# Patient Record
Sex: Female | Born: 1995 | Race: White | Hispanic: No | Marital: Single | Smoking: Never smoker
Health system: Southern US, Community
[De-identification: ages and names within clinical notes are randomized; demographics above are authoritative.]

## PROBLEM LIST (undated history)

## (undated) DIAGNOSIS — R55 Syncope and collapse: Secondary | ICD-10-CM

## (undated) DIAGNOSIS — R42 Dizziness and giddiness: Secondary | ICD-10-CM

## (undated) DIAGNOSIS — R0602 Shortness of breath: Secondary | ICD-10-CM

## (undated) DIAGNOSIS — M25871 Other specified joint disorders, right ankle and foot: Secondary | ICD-10-CM

## (undated) DIAGNOSIS — J387 Other diseases of larynx: Secondary | ICD-10-CM

## (undated) HISTORY — PX: POLYPECTOMY: SHX149

## (undated) HISTORY — DX: Other diseases of larynx: J38.7

## (undated) HISTORY — PX: TONSILLECTOMY: SUR1361

## (undated) HISTORY — DX: Shortness of breath: R06.02

## (undated) HISTORY — DX: Syncope and collapse: R55

## (undated) HISTORY — PX: TYMPANOSTOMY TUBE PLACEMENT: SHX32

## (undated) HISTORY — DX: Dizziness and giddiness: R42

## (undated) HISTORY — DX: Other specified joint disorders, right ankle and foot: M25.871

---

## 2018-01-30 ENCOUNTER — Ambulatory Visit (INDEPENDENT_AMBULATORY_CARE_PROVIDER_SITE_OTHER): Payer: Self-pay | Admitting: Orthopedic Surgery

## 2018-01-30 DIAGNOSIS — M25511 Pain in right shoulder: Secondary | ICD-10-CM

## 2018-01-31 ENCOUNTER — Other Ambulatory Visit (INDEPENDENT_AMBULATORY_CARE_PROVIDER_SITE_OTHER): Payer: Self-pay | Admitting: Orthopedic Surgery

## 2018-01-31 DIAGNOSIS — M25511 Pain in right shoulder: Secondary | ICD-10-CM

## 2018-02-06 ENCOUNTER — Encounter (INDEPENDENT_AMBULATORY_CARE_PROVIDER_SITE_OTHER): Payer: Self-pay | Admitting: Orthopedic Surgery

## 2018-02-06 NOTE — Progress Notes (Signed)
   Post-Op Visit Note   Patient: Hannah Bush           Date of Birth: 1995/03/26           MRN: [id number] Visit Date: 01/30/2018 PCP: No primary care provider on file.   Assessment & Plan:  Chief Complaint: No chief complaint on file.  Visit Diagnoses:  1. Acute pain of right shoulder     Plan: Patient presents for evaluation of right shoulder pain.  She had a fall on her outstretched arm.  This happened yesterday.  I reviewed the real-time game film of the event.  She has had mild improvement but this could be either a subluxation episode or a superior labral tear type of episode.  She has been taking Motrin.  On examination she has positive O'Brien's on the right negative on the left.  Good rotator cuff strength.  No masses lymphadenopathy or skin changes noted in that shoulder girdle region.  She has equivocal apprehension for anterior instability.  Both shoulders on exam supine have about the same amount of anterior and posterior instability  Follow-Up Instructions: Return if symptoms worsen or fail to improve.   Orders:  No orders of the defined types were placed in this encounter.  No orders of the defined types were placed in this encounter.   Imaging: No results found.  PMFS History: There are no active problems to display for this patient.  History reviewed. No pertinent past medical history.  History reviewed. No pertinent family history.  History reviewed. No pertinent surgical history. Social History   Occupational History  . Not on file  Tobacco Use  . Smoking status: Not on file  Substance and Sexual Activity  . Alcohol use: Not on file  . Drug use: Not on file  . Sexual activity: Not on file

## 2018-02-15 ENCOUNTER — Other Ambulatory Visit: Payer: Self-pay

## 2018-05-01 ENCOUNTER — Emergency Department (HOSPITAL_COMMUNITY)
Admission: EM | Admit: 2018-05-01 | Discharge: 2018-05-01 | Disposition: A | Discharge status: Home / Self Care | Payer: Self-pay | Attending: Emergency Medicine | Admitting: Emergency Medicine

## 2018-05-01 ENCOUNTER — Encounter (HOSPITAL_COMMUNITY): Payer: Self-pay | Admitting: Family Medicine

## 2018-05-01 DIAGNOSIS — R55 Syncope and collapse: Secondary | ICD-10-CM | POA: Insufficient documentation

## 2018-05-01 DIAGNOSIS — Z79899 Other long term (current) drug therapy: Secondary | ICD-10-CM | POA: Insufficient documentation

## 2018-05-01 HISTORY — DX: Syncope and collapse: R55

## 2018-05-01 LAB — BASIC METABOLIC PANEL
Anion gap: 8 (ref 5–15)
BUN: 11 mg/dL (ref 6–20)
CO2: 23 mmol/L (ref 22–32)
Calcium: 9.5 mg/dL (ref 8.9–10.3)
Chloride: 107 mmol/L (ref 98–111)
Creatinine, Ser: 0.72 mg/dL (ref 0.44–1.00)
GFR calc Af Amer: >60 mL/min (ref >60)
GFR calc non Af Amer: >60 mL/min (ref >60)
Glucose, Bld: 99 mg/dL (ref 70–99)
Potassium: 4.2 mmol/L (ref 3.5–5.1)
Sodium: 138 mmol/L (ref 135–145)

## 2018-05-01 LAB — URINALYSIS, ROUTINE W REFLEX MICROSCOPIC
Bilirubin Urine: NEGATIVE
Glucose, UA: NEGATIVE mg/dL
Ketones, ur: NEGATIVE mg/dL
Leukocytes, UA: NEGATIVE
Nitrite: NEGATIVE
Protein, ur: NEGATIVE mg/dL
Specific Gravity, Urine: 1.011 (ref 1.005–1.030)
pH: 5 (ref 5.0–8.0)

## 2018-05-01 LAB — CBC
HCT: 42.2 % (ref 36.0–46.0)
Hemoglobin: 13.5 g/dL (ref 12.0–15.0)
MCH: 29.9 pg (ref 26.0–34.0)
MCHC: 32 g/dL (ref 30.0–36.0)
MCV: 93.4 fL (ref 80.0–100.0)
Platelets: 222 10*3/uL (ref 150–400)
RBC: 4.52 MIL/uL (ref 3.87–5.11)
RDW: 12.7 % (ref 11.5–15.5)
WBC: 5.3 10*3/uL (ref 4.0–10.5)
nRBC: 0 % (ref 0.0–0.2)

## 2018-05-01 LAB — I-STAT BETA HCG BLOOD, ED (MC, WL, AP ONLY)

## 2018-05-01 LAB — CBG MONITORING, ED: Glucose-Capillary: 82 mg/dL (ref 70–99)

## 2018-05-01 MED ORDER — SODIUM CHLORIDE 0.9% FLUSH: 3.0000 mL | Freq: Once | INTRAVENOUS | Status: DC

## 2018-05-01 MED ORDER — SODIUM CHLORIDE 0.9 % IV BOLUS
1000.0000 mL | Freq: Once | INTRAVENOUS | Status: AC
  Administered 2018-05-01: 1000 mL via INTRAVENOUS

## 2018-05-01 NOTE — ED Triage Notes (Signed)
Patient reports she had a witnessed syncopal episode for a few seconds. Patients athlete trainer has accompanied patient but was not present when she had her syncopal episode. Athlete trainer reports that someone caught her and didn't hit her head. Patient has had these same symptoms before and dx vasovagal. For the last three days patient reports she has been feeling lightheadedness. Patient states she has been drinking plenty of fluids.

## 2018-05-01 NOTE — ED Provider Notes (Signed)
[facility]-EMERGENCY DEPT Provider Note   CSN: [id number] Arrival date & time: 05/01/18  1316     History   Chief Complaint Chief Complaint  Patient presents with  . Loss of Consciousness    HPI Lenola Capitano is a 23 y.o. female.  HPI  23 year old female presents after syncope.  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  She had been standing for an hour plus.  She started to all of a sudden feel lightheaded and like her vision was darkening.  This lasted a few minutes and then she passed out.  She was caught by someone and did not injure herself.  No headache, chest pain, shortness of breath, vomiting, or any other symptoms.  She felt a little lightheaded when she first awoke and was only unconscious for a few seconds.  However now she is feeling fine.  She has had syncope a few times before in the past and has been told that it was vasovagal when she sought medical care.  No palpitations.  Patient has previously been feeling fine and ate normal meals today.  No strenuous activity today.  Past Medical History:  Diagnosis Date  . Vaso vagal episode     There are no active problems to display for this patient.   Past Surgical History:  Procedure Laterality Date  . TONSILLECTOMY    . TYMPANOSTOMY TUBE PLACEMENT       OB History   No obstetric history on file.      Home Medications    Prior to Admission medications   Medication Sig Start Date End Date Taking? Authorizing Provider  Cholecalciferol (VITAMIN D) 10 MCG/ML LIQD Take 10 mcg by mouth once a week.   Yes [provider]  ibuprofen (ADVIL,MOTRIN) 200 MG tablet Take 400-800 mg by mouth every 6 (six) hours as needed for headache or moderate pain.   Yes [provider]    Family History History reviewed. No pertinent family history.  Social History Social History   Tobacco Use  . Smoking status: Never Smoker  . Smokeless tobacco: Never Used  Substance Use  Topics  . Alcohol use: Not Currently  . Drug use: Never     Allergies   Patient has no allergy information on record.   Review of Systems Review of Systems  Respiratory: Negative for shortness of breath.   Cardiovascular: Negative for chest pain.  Gastrointestinal: Negative for abdominal pain and vomiting.  Neurological: Positive for syncope and light-headedness. Negative for headaches.  All other systems reviewed and are negative.    Physical Exam Updated Vital Signs BP 127/82 (BP Location: Left Arm)   Pulse 64   Temp 98.4 F (36.9 C) (Oral)   Resp 12   Ht 6' (1.829 m)   Wt 81.6 kg   LMP 04/29/2018   SpO2 100%   BMI 24.41 kg/m   Physical Exam Vitals signs and nursing note reviewed.  Constitutional:      General: She is not in acute distress.    Appearance: She is well-developed. She is not ill-appearing or diaphoretic.  HENT:     Head: Normocephalic and atraumatic.     Right Ear: External ear normal.     Left Ear: External ear normal.     Nose: Nose normal.  Eyes:     General:        Right eye: No discharge.        Left eye: No discharge.  Cardiovascular:  Rate and Rhythm: Normal rate and regular rhythm.     Heart sounds: Normal heart sounds. No murmur.  Pulmonary:     Effort: Pulmonary effort is normal.     Breath sounds: Normal breath sounds.  Abdominal:     Palpations: Abdomen is soft.     Tenderness: There is no abdominal tenderness.  Skin:    General: Skin is warm and dry.  Neurological:     Mental Status: She is alert.     Comments: CN 3-12 grossly intact. 5/5 strength in all 4 extremities. Grossly normal sensation. Normal finger to nose.   Psychiatric:        Mood and Affect: Mood is not anxious.      ED Treatments / Results  Labs (all labs ordered are listed, but only abnormal results are displayed) Labs Reviewed  BASIC METABOLIC PANEL  CBC  URINALYSIS, ROUTINE W REFLEX MICROSCOPIC  CBG MONITORING, ED  I-STAT BETA HCG BLOOD, ED  ([facility], [facility], [facility] ONLY)    EKG EKG Interpretation  Date/Time:  Monday May 01 2018 13:35:05 EST Ventricular Rate:  68 PR Interval:    QRS Duration: 88 QT Interval:  382 QTC Calculation: 407 R Axis:   78 Text Interpretation:  Normal sinus rhythm ST elev, probable normal early repol pattern No old tracing to compare Confirmed by Pricilla Loveless [phone]) on 05/01/2018 3:52:26 PM   Radiology No results found.  Procedures Procedures (including critical care time)  Medications Ordered in ED Medications  sodium chloride flush (NS) 0.9 % injection 3 mL (has no administration in time range)  sodium chloride 0.9 % bolus 1,000 mL (1,000 mLs Intravenous New Bag/Given 05/01/18 1545)     Initial Impression / Assessment and Plan / ED Course  I have reviewed the triage vital signs and the nursing notes.  Pertinent labs & imaging results that were available during my care of the patient were reviewed by me and considered in my medical decision making (see chart for details).     Patient's vital signs, labs, exam and ECG are all benign.  No concerning findings on ECG that would indicate a cardiac cause of syncope.  No concerning historical findings such as sudden onset headache.  Probably this was from prolonged standing.  Otherwise, I feel this is likely benign syncope and she can follow-up with PCP.  We discussed return precautions.  Final Clinical Impressions(s) / ED Diagnoses   Final diagnoses:  Syncope and collapse    ED Discharge Orders    None       Pricilla Loveless, MD 05/01/18 1625

## 2018-05-01 NOTE — Discharge Instructions (Signed)
Be sure to follow-up with your primary care physician.  If you develop recurrent passing out, passing out while doing exercise or strenuous activity, severe headache, chest pain, shortness of breath, palpitations, or any other new/concerning symptoms then return to the ER for evaluation.

## 2018-05-10 ENCOUNTER — Encounter: Payer: Self-pay | Admitting: Family Medicine

## 2018-05-10 DIAGNOSIS — R55 Syncope and collapse: Secondary | ICD-10-CM

## 2018-05-10 HISTORY — DX: Syncope and collapse: R55

## 2018-05-16 ENCOUNTER — Ambulatory Visit (INDEPENDENT_AMBULATORY_CARE_PROVIDER_SITE_OTHER): Payer: 59 | Admitting: Cardiology

## 2018-05-16 ENCOUNTER — Ambulatory Visit: Payer: Self-pay | Admitting: Cardiology

## 2018-05-16 ENCOUNTER — Encounter: Payer: Self-pay | Admitting: Cardiology

## 2018-05-16 VITALS — BP 120/80 | HR 71 | Ht 72.0 in | Wt 178.2 lb

## 2018-05-16 DIAGNOSIS — R55 Syncope and collapse: Secondary | ICD-10-CM | POA: Diagnosis not present

## 2018-05-16 NOTE — Progress Notes (Signed)
Cardiology Office Note    Date:  05/18/2018   ID:  Hannah Bush, DOB 1995-08-27, MRN [id number]  PCP:  Patient, No Pcp Per  Cardiologist:  Armanda Magic, MD   Chief Complaint  Patient presents with  . New Patient (Initial Visit)    Syncope    History of Present Illness:  Hannah Bush is a 23 y.o. female who is being seen today for the evaluation of syncope at the request of [facility] athletic training Dr.John Susann Givens.  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  Initially started out at [facility] in [place].  She has a history of syncopal episodes in the past and had a complete work-up in [place] in 2018 after presenting to the ER with syncope.    According to the hospital notes in [place] she had been playing basketball that afternoon and started to become short of breath and dizzy.  The room started spinning and she denied any lightheadedness.  This occurred for a few seconds and then she had a syncopal episode for a few seconds and then regained consciousness.  She was only mildly confused for a few seconds afterwards.  She did not have any tongue biting, tonic-clonic seizures.  This apparently happened 1 week prior as well when she was going from sitting to standing.  She says when she gets these episodes she will get flushed and diaphoretic and feel poorly.    She has a family history of chronically low blood pressure in her mother and her sister.  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  Followed on telemetry with no arrhythmias.  Given normal saline IV fluids.  She had a 2D echocardiogram done in December 2018 which revealed normal LV size with low normal LV function EF 50 to 55%, trivial MR, mild to moderate TR.  Chest x-ray was normal.  CT the head was normal.  Labs were all within normal limits.  EKG during that evaluation showed sinus rhythm with sinus  arrhythmia except for a 6-second pause noted on telemetry during a blood draw in the hospital.  She also gives a history of prior syncopal episodes and  had a cardiac work-up years prior to that as well.  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  No further treatment was recommended.  She denies any chest pain or pressure, PND, orthopnea, lower extremity edema, palpitations.  She denies any smoking, alcohol use or recreational drugs.  She takes no medications.  Past Medical History:  Diagnosis Date  . Dizzy   . Laryngeal disorder   . SOB (shortness of breath)   . Syncope and collapse     Past Surgical History:  Procedure Laterality Date  . POLYPECTOMY      Current Medications: No outpatient medications have been marked as taking for the 05/16/18 encounter (Office Visit) with Quintella Reichert, MD.    Allergies:   Patient has no known allergies.   Social History   Socioeconomic History  . Marital status: Single    Spouse name: Not on file  . Number of children: Not on file  . Years of education: Not on file  . Highest education level: Not on file  Occupational History  . Not on file  Social Needs  . Financial resource strain: Not on file  . Food insecurity:  Worry: Not on file    Inability: Not on file  . Transportation needs:    Medical: Not on file    Non-medical: Not on file  Tobacco Use  . Smoking status: Never Smoker  . Smokeless tobacco: Never Used  Substance and Sexual Activity  . Alcohol use: Never    Frequency: Never  . Drug use: Never  . Sexual activity: Not on file  Lifestyle  . Physical activity:    Days per week: Not on file    Minutes per session: Not on file  . Stress: Not on file  Relationships  . Social connections:    Talks on phone: Not on file    Gets together: Not on file    Attends religious service: Not on file    Active member of club or organization: Not  on file    Attends meetings of clubs or organizations: Not on file    Relationship status: Not on file  Other Topics Concern  . Not on file  Social History Narrative  . Not on file     Family History:  The patient's family history includes Hypotension in her mother and sister.   ROS:   Please see the history of present illness.    ROS All other systems reviewed and are negative.  No flowsheet data found.     PHYSICAL EXAM:   VS:  BP 120/80   Pulse 71   Ht 6' (1.829 m)   Wt 178 lb 3.2 oz (80.8 kg)   SpO2 99%   BMI 24.17 kg/m    GEN: Well nourished, well developed, in no acute distress  HEENT: normal  Neck: no JVD, carotid bruits, or masses Cardiac: RRR; no murmurs, rubs, or gallops,no edema.  Intact distal pulses bilaterally.  Respiratory:  clear to auscultation bilaterally, normal work of breathing GI: soft, nontender, nondistended, + BS MS: no deformity or atrophy  Skin: warm and dry, no rash Neuro:  Alert and Oriented x 3, Strength and sensation are intact Psych: euthymic mood, full affect  Wt Readings from Last 3 Encounters:  05/16/18 178 lb 3.2 oz (80.8 kg)     Studies/Labs Reviewed:   EKG:  EKG is ordered today.  The ekg ordered today demonstrates normal sinus rhythm with first-degree AV block and normal QTC and no ST changes.  Recent Labs: No results found for requested labs within last 8760 hours.   Lipid Panel No results found for: CHOL, TRIG, HDL, CHOLHDL, VLDL, LDLCALC, LDLDIRECT  Additional studies/ records that were reviewed today include:  Office and hospital notes from [facility]    ASSESSMENT:    1. Syncope, unspecified syncope type      PLAN:  In order of problems listed above:  1. Syncope -she has had a long history of syncopal episodes.  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 She had a syncopal episodes when she was playing in [place] with a full evaluation by cardiology including a 2D  echocardiogram that was normal and a head CT that was normal.  She was noted to have a 6-second pause on telemetry during that hospital stay during a blood draw.  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  Now she is presenting again with several syncopal episodes.  These occur usually when she is playing basketball and she will all of a sudden start to get flushed and feel hot and  not feel well at all then passed out.  She has had a few episodes when she has not been exerting herself.  She denies any chest pain or pressure.  There is no family history of sudden cardiac death.  She does try to stay well-hydrated.  EKG today shows no ischemia with slightly prolonged PR interval and normal QTC.  I have recommended proceeding with 2D echocardiogram to reassess LV function since her EF was 50 to 55% on the echo in 2018.  I will also get a 30-day event monitor.  It sounds like she does have vasovagal syncope but it is now impeding her ability to play basketball.  Could consider a trial of low-dose beta-blocker or SSRI.  I am going to refer to Dr. Graciela Husbands for further recommendations on how to treat her vasovagal syncope which has become quite a problem when playing basketball competitively.    Medication Adjustments/Labs and Tests Ordered: Current medicines are reviewed at length with the patient today.  Concerns regarding medicines are outlined above.  Medication changes, Labs and Tests ordered today are listed in the Patient Instructions below.  Patient Instructions  Medication Instructions:  Your physician recommends that you continue on your current medications as directed. Please refer to the Current Medication list given to you today.  If you need a refill on your cardiac medications before your next appointment, please call your pharmacy.   Lab work: None If you have labs (blood work) drawn today and your tests are completely normal, you will receive  your results only by: Marland Kitchen MyChart Message (if you have MyChart) OR . A paper copy in the mail If you have any lab test that is abnormal or we need to change your treatment, we will call you to review the results.  Testing/Procedures: Your physician has requested that you have an echocardiogram. Echocardiography is a painless test that uses sound waves to create images of your heart. It provides your doctor with information about the size and shape of your heart and how well your heart's chambers and valves are working. This procedure takes approximately one hour. There are no restrictions for this procedure.  Your physician has recommended that you wear an event monitor. Event monitors are medical devices that record the heart's electrical activity. Doctors most often [place] these monitors to diagnose arrhythmias. Arrhythmias are problems with the speed or rhythm of the heartbeat. The monitor is a small, portable device. You can wear one while you do your normal daily activities. This is usually used to diagnose what is causing palpitations/syncope (passing out).  Follow-Up: As needed following results.   You have been referred to Dr. Graciela Husbands for a tilt test.      Signed, Armanda Magic, MD  05/18/2018 12:39 PM    [facility] [address] [place], [place], [place]  [address] Phone: [phone]; Fax: [phone]

## 2018-05-16 NOTE — Patient Instructions (Signed)
Medication Instructions:  Your physician recommends that you continue on your current medications as directed. Please refer to the Current Medication list given to you today.  If you need a refill on your cardiac medications before your next appointment, please call your pharmacy.   Lab work: None If you have labs (blood work) drawn today and your tests are completely normal, you will receive your results only by: Marland Kitchen MyChart Message (if you have MyChart) OR . A paper copy in the mail If you have any lab test that is abnormal or we need to change your treatment, we will call you to review the results.  Testing/Procedures: Your physician has requested that you have an echocardiogram. Echocardiography is a painless test that uses sound waves to create images of your heart. It provides your doctor with information about the size and shape of your heart and how well your heart's chambers and valves are working. This procedure takes approximately one hour. There are no restrictions for this procedure.  Your physician has recommended that you wear an event monitor. Event monitors are medical devices that record the heart's electrical activity. Doctors most often [place] these monitors to diagnose arrhythmias. Arrhythmias are problems with the speed or rhythm of the heartbeat. The monitor is a small, portable device. You can wear one while you do your normal daily activities. This is usually used to diagnose what is causing palpitations/syncope (passing out).  Follow-Up: As needed following results.   You have been referred to Dr. Graciela Husbands for a tilt test.

## 2018-05-19 ENCOUNTER — Ambulatory Visit: Payer: Self-pay | Admitting: Cardiology

## 2018-05-31 ENCOUNTER — Ambulatory Visit (INDEPENDENT_AMBULATORY_CARE_PROVIDER_SITE_OTHER): Payer: 59

## 2018-05-31 ENCOUNTER — Other Ambulatory Visit: Payer: Self-pay

## 2018-05-31 ENCOUNTER — Ambulatory Visit (HOSPITAL_COMMUNITY): Payer: 59 | Attending: Cardiovascular Disease

## 2018-05-31 DIAGNOSIS — R55 Syncope and collapse: Secondary | ICD-10-CM | POA: Diagnosis present

## 2018-06-07 ENCOUNTER — Ambulatory Visit: Payer: 59 | Admitting: Cardiology

## 2018-06-23 ENCOUNTER — Encounter: Payer: Self-pay | Admitting: Cardiology

## 2018-07-06 ENCOUNTER — Institutional Professional Consult (permissible substitution): Payer: 59 | Admitting: Internal Medicine

## 2019-01-23 ENCOUNTER — Other Ambulatory Visit: Payer: Self-pay

## 2019-01-23 ENCOUNTER — Encounter: Payer: Self-pay | Admitting: Sports Medicine

## 2019-01-23 ENCOUNTER — Ambulatory Visit (INDEPENDENT_AMBULATORY_CARE_PROVIDER_SITE_OTHER): Payer: BC Managed Care – PPO | Admitting: Sports Medicine

## 2019-01-23 VITALS — BP 120/82 | Ht 72.0 in | Wt 180.0 lb

## 2019-01-23 DIAGNOSIS — M216X1 Other acquired deformities of right foot: Secondary | ICD-10-CM | POA: Diagnosis not present

## 2019-01-23 DIAGNOSIS — M216X2 Other acquired deformities of left foot: Secondary | ICD-10-CM | POA: Diagnosis not present

## 2019-01-23 DIAGNOSIS — M2142 Flat foot [pes planus] (acquired), left foot: Secondary | ICD-10-CM | POA: Diagnosis not present

## 2019-01-23 DIAGNOSIS — M2141 Flat foot [pes planus] (acquired), right foot: Secondary | ICD-10-CM

## 2019-01-23 NOTE — Patient Instructions (Signed)
-  Wear the orthotics that we made for you for the next week.  If you notice any spots in the orthotic that are bothersome to your feet let us know and we can correct them at your next visit  -We will see you back next Wednesday morning.  At that time we will ultrasound your ankle to better evaluate your ankle pain.  We can also make additional modifications to your orthotics at that time

## 2019-01-23 NOTE — Progress Notes (Signed)
PCP: Patient, No Pcp Per  Subjective:   HPI: Patient is a 23 y.o. female here for custom orthotics.  Patient is a Building services engineer at [employer].  She was referred by her athletic trainer due to chronic ankle pain and instability.  Patient notes she is getting pain in her right ankle on the medial aspect of her ankle.  She notes the pain is not present with walking only with running and jumping.  This pain is been present for the last several months.  She denies any specific injury or trauma.  She denies any swelling at her ankle.  She denies any bruising.  Patient has no numbness or tingling at her ankle.  She had previous set of orthotics made for her in [place] where she is from.  She has been wearing these for several years now but notes they have not helped improve her ankle pain.   Review of Systems: See HPI above.  Past Medical History:  Diagnosis Date  . Dizzy   . Laryngeal disorder   . SOB (shortness of breath)   . Syncope and collapse   . Vaso vagal episode     Current Outpatient Medications on File Prior to Visit  Medication Sig Dispense Refill  . Cholecalciferol (VITAMIN D) 10 MCG/ML LIQD Take 10 mcg by mouth once a week.    Marland Kitchen ibuprofen (ADVIL,MOTRIN) 200 MG tablet Take 400-800 mg by mouth every 6 (six) hours as needed for headache or moderate pain.     No current facility-administered medications on file prior to visit.     Past Surgical History:  Procedure Laterality Date  . POLYPECTOMY    . TONSILLECTOMY    . TYMPANOSTOMY TUBE PLACEMENT      No Known Allergies  Social History   Socioeconomic History  . Marital status: Single    Spouse name: Not on file  . Number of children: Not on file  . Years of education: Not on file  . Highest education level: Not on file  Occupational History  . Not on file  Social Needs  . Financial resource strain: Not on file  . Food insecurity    Worry: Not on file    Inability: Not on file  . Transportation needs    Medical:  Not on file    Non-medical: Not on file  Tobacco Use  . Smoking status: Never Smoker  . Smokeless tobacco: Never Used  Substance and Sexual Activity  . Alcohol use: Never    Frequency: Never  . Drug use: Never  . Sexual activity: Not on file  Lifestyle  . Physical activity    Days per week: Not on file    Minutes per session: Not on file  . Stress: Not on file  Relationships  . Social Musician on phone: Not on file    Gets together: Not on file    Attends religious service: Not on file    Active member of club or organization: Not on file    Attends meetings of clubs or organizations: Not on file    Relationship status: Not on file  . Intimate partner violence    Fear of current or ex partner: Not on file    Emotionally abused: Not on file    Physically abused: Not on file    Forced sexual activity: Not on file  Other Topics Concern  . Not on file  Social History Narrative   ** Merged History Encounter **  Family History  Problem Relation Age of Onset  . Hypotension Mother   . Hypotension Sister         Objective:  Physical Exam: BP 120/82   Ht 6' (1.829 m)   Wt 180 lb (81.6 kg)   BMI 24.41 kg/m  Gen: NAD, comfortable in exam room Lungs: Breathing comfortably on room air Ankle/Foot Exam Right -Inspection: Loss of transverse and longitudinal arch.  Pronation deformity both with standing and with walking. -Palpation: No tenderness to palpation -ROM: Normal ROM with dorsiflexion, plantarflexion, inversion, eversion -Strength: Dorsiflexion: 5/5; Plantarflexion: 5/5; Inversion: 5/5; Eversion: 5/5 but patient had pain with resisted inversion. -Limb neurovascularly intact, no instability noted  Contralateral Ankle -Inspection: Loss of transverse and longitudinal arch.  Pronation deformity both the standing and with walking -Palpation: Medial malleolus: non-tender; lateral malleolus: non-tender; 5th metatarsal: non-tender; calcaneus: non-tender;  plantar fascia insertion: non-tender -ROM: Normal ROM with dorsiflexion, plantarflexion, inversion, eversion -Strength: Dorsiflexion: 5/5; Plantarflexion: 5/5; Inversion: 5/5; Eversion: 5/5 -Limb neurovascularly intact, no instability noted  -Gait: Patient in pronation with walking and running    Assessment & Plan:  Patient is a 23 y.o. female here for custom orthotics  1.  Pes planus and pronation deformity bilaterally Patient was fitted for a : standard, cushioned, semi-rigid orthotic. The orthotic was heated and afterward the patient stood on the orthotic blank positioned on the orthotic stand. The patient was positioned in subtalar neutral position and 10 degrees of ankle dorsiflexion in a weight bearing stance. After completion of molding, a stable base was applied to the orthotic blank. The blank was ground to a stable position for weight bearing. Size: 9 Base: Blue EVA Posting: None Additional orthotic padding: None  Patient still with a mild pronation gait with running.  Patient was instructed to wear the orthotics for the next week.  She will follow-up in 1 week for an ultrasound of her ankle to better evaluate her ankle pain as well as make any necessary adjustments to her orthotics  Patient seen and evaluated with the sports medicine fellow.  I agree with the above plan of care.  Patient return to the office next week for an ultrasound of her ankle.  We will adjust her orthotics at that visit if needed.

## 2019-01-31 ENCOUNTER — Ambulatory Visit: Payer: 59 | Admitting: Sports Medicine

## 2019-02-08 ENCOUNTER — Encounter: Payer: Self-pay | Admitting: Orthopedic Surgery

## 2019-02-08 ENCOUNTER — Ambulatory Visit (INDEPENDENT_AMBULATORY_CARE_PROVIDER_SITE_OTHER): Payer: BC Managed Care – PPO | Admitting: Orthopedic Surgery

## 2019-02-08 ENCOUNTER — Other Ambulatory Visit: Payer: Self-pay

## 2019-02-08 VITALS — Ht 72.0 in | Wt 180.0 lb

## 2019-02-08 DIAGNOSIS — M25871 Other specified joint disorders, right ankle and foot: Secondary | ICD-10-CM

## 2019-02-20 ENCOUNTER — Encounter: Payer: Self-pay | Admitting: Orthopedic Surgery

## 2019-02-20 NOTE — Progress Notes (Signed)
Office Visit Note   Patient: Hannah Bush           Date of Birth: 1996-01-25           MRN: [id number] Visit Date: 02/08/2019              Requested by: No referring provider defined for this encounter. PCP: Patient, No Pcp Per  Chief Complaint  Patient presents with  . 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  Patient denies any family history of gout.  Assessment & Plan: Visit Diagnoses: No diagnosis found.  Plan: Discussed the possibility of arthroscopic intervention if the intra-articular injection does not help.  Discussed that she could continue to play if symptomatic but she would not do any further damage to her ankle.  Follow-Up Instructions: No follow-ups on file.   Ortho Exam  Patient is alert, oriented, no adenopathy, well-dressed, normal affect, normal respiratory effort. Examination patient has good pulses she has a negative anterior drawer the syndesmosis is nontender to compression or palpation.  She has good ankle and subtalar motion there is no redness patient does have bruising medially and laterally with tenderness to palpation over the deltoid and lateral ankle ligaments.  Review of the MRI scan shows no osteochondral defect no bony abnormalities no tendon tears no ligamentous instability.  Imaging: No results found. No images are attached to the encounter.  Labs: No results found for: HGBA1C, ESRSEDRATE, CRP, LABURIC, REPTSTATUS, GRAMSTAIN, CULT, LABORGA   No results found for: ALBUMIN, PREALBUMIN, LABURIC  No results found for: MG No results found for: VD25OH  No results found for: PREALBUMIN CBC EXTENDED Latest Ref Rng & Units 05/01/2018  WBC 4.0 - 10.5 K/uL 5.3  RBC 3.87  - 5.11 MIL/uL 4.52  HGB 12.0 - 15.0 g/dL 55.9  HCT 74.1 - 63.8 % 42.2  PLT 150 - 400 K/uL 222     Body mass index is 24.41 kg/m.  Orders:  No orders of the defined types were placed in this encounter.  No orders of the defined types were placed in this encounter.    Procedures: No procedures performed  Clinical Data: No additional findings.  ROS:  All other systems negative, except as noted in the HPI. Review of Systems  Objective: Vital Signs: Ht 6' (1.829 m)   Wt 180 lb (81.6 kg)   BMI 24.41 kg/m   Specialty Comments:  No specialty comments available.  PMFS History: Patient Active Problem List   Diagnosis Date Noted  . Syncope 05/10/2018   Past Medical History:  Diagnosis Date  . Dizzy   . Laryngeal disorder   . SOB (shortness of breath)   . Syncope and collapse   . Vaso vagal episode     Family History  Problem Relation Age of Onset  . Hypotension Mother   . Hypotension Sister     Past Surgical History:  Procedure Laterality Date  . POLYPECTOMY    . TONSILLECTOMY    . TYMPANOSTOMY TUBE PLACEMENT     Social History   Occupational History  . Not on file  Tobacco Use  . Smoking status: Never Smoker  . Smokeless tobacco: Never  Used  Substance and Sexual Activity  . Alcohol use: Never    Frequency: Never  . Drug use: Never  . Sexual activity: Not on file

## 2019-03-05 ENCOUNTER — Ambulatory Visit (INDEPENDENT_AMBULATORY_CARE_PROVIDER_SITE_OTHER): Payer: Self-pay | Admitting: Orthopedic Surgery

## 2019-03-05 ENCOUNTER — Other Ambulatory Visit: Payer: Self-pay

## 2019-03-05 ENCOUNTER — Encounter: Payer: Self-pay | Admitting: Orthopedic Surgery

## 2019-03-05 ENCOUNTER — Ambulatory Visit (INDEPENDENT_AMBULATORY_CARE_PROVIDER_SITE_OTHER): Payer: BC Managed Care – PPO | Admitting: Orthopedic Surgery

## 2019-03-05 VITALS — Ht 72.0 in | Wt 180.0 lb

## 2019-03-05 DIAGNOSIS — M546 Pain in thoracic spine: Secondary | ICD-10-CM

## 2019-03-05 DIAGNOSIS — M25871 Other specified joint disorders, right ankle and foot: Secondary | ICD-10-CM

## 2019-03-05 NOTE — Progress Notes (Signed)
Office Visit Note   Patient: Hannah Bush           Date of Birth: 13-Jul-1995           MRN: [id number] Visit Date: 03/05/2019              Requested by: No referring provider defined for this encounter. PCP: Patient, No Pcp Per  Chief Complaint  Patient presents with  . 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  Patient complains of stiffness stinging and burning.  Patient has had MRI scan that showed no articular cartilage defects no ligamentous instability no tendon defect.  Assessment & Plan: Visit Diagnoses:  1. Impingement of right ankle joint     Plan: Discussed with patient options including the possibility of arthroscopic debridement.  Patient states she would like to proceed with arthroscopy risk and benefits were discussed including persistent pain need for additional surgery.  Patient states she understands wishes to proceed.  Anticipate she would be out of competition for at least 6 weeks.  Follow-Up Instructions: Return in about 2 weeks (around 03/19/2019).   Ortho Exam  Patient is alert, oriented, no adenopathy, well-dressed, normal affect, normal respiratory effort. Examination patient does have swelling over the anterior medial joint line.  Anterior drawer is stable no ligamentous instability she has no tenderness to palpation over the lateral ankle ligaments or over the deltoid.  Her tendons are nontender to palpation she can do a single limb heel raise and recreates good hindfoot varus with a single limb heel raise no posterior tibial tendon insufficiency however on the right foot she does have increased valgus of the right hindfoot compared to the left hindfoot.  Imaging: No results found. No images are attached to the  encounter.  Labs: No results found for: HGBA1C, ESRSEDRATE, CRP, LABURIC, REPTSTATUS, GRAMSTAIN, CULT, LABORGA   No results found for: ALBUMIN, PREALBUMIN, LABURIC  No results found for: MG No results found for: VD25OH  No results found for: PREALBUMIN CBC EXTENDED Latest Ref Rng & Units 05/01/2018  WBC 4.0 - 10.5 K/uL 5.3  RBC 3.87 - 5.11 MIL/uL 4.52  HGB 12.0 - 15.0 g/dL 13.5  HCT 36.0 - 46.0 % 42.2  PLT 150 - 400 K/uL 222     Body mass index is 24.41 kg/m.  Orders:  No orders of the defined types were placed in this encounter.  No orders of the defined types were placed in this encounter.    Procedures: No procedures performed  Clinical Data: No additional findings.  ROS:  All other systems negative, except as noted in the HPI. Review of Systems  Objective: Vital Signs: Ht 6' (1.829 m)   Wt 180 lb (81.6 kg)   BMI 24.41 kg/m   Specialty Comments:  No specialty comments available.  PMFS History: Patient Active Problem List   Diagnosis Date Noted  . Syncope 05/10/2018   Past Medical History:  Diagnosis Date  . Dizzy   . Laryngeal disorder   . SOB (shortness of breath)   . Syncope and collapse   . Vaso vagal episode     Family History  Problem Relation Age of Onset  . Hypotension Mother   . Hypotension Sister     Past Surgical  History:  Procedure Laterality Date  . POLYPECTOMY    . TONSILLECTOMY    . TYMPANOSTOMY TUBE PLACEMENT     Social History   Occupational History  . Not on file  Tobacco Use  . Smoking status: Never Smoker  . Smokeless tobacco: Never Used  Substance and Sexual Activity  . Alcohol use: Never  . Drug use: Never  . Sexual activity: Not on file

## 2019-03-08 ENCOUNTER — Other Ambulatory Visit: Payer: Self-pay

## 2019-03-08 ENCOUNTER — Other Ambulatory Visit: Payer: Self-pay | Admitting: Physician Assistant

## 2019-03-08 ENCOUNTER — Encounter (HOSPITAL_BASED_OUTPATIENT_CLINIC_OR_DEPARTMENT_OTHER): Payer: Self-pay | Admitting: Orthopedic Surgery

## 2019-03-09 ENCOUNTER — Other Ambulatory Visit: Payer: Self-pay

## 2019-03-09 ENCOUNTER — Encounter: Payer: Self-pay | Admitting: Orthopedic Surgery

## 2019-03-09 NOTE — Progress Notes (Signed)
   Post-Op Visit Note   Patient: Hannah Bush           Date of Birth: 06/11/1995           MRN: [id number] Visit Date: 03/05/2019 PCP: Patient, No Pcp Per   Assessment & Plan:  Chief Complaint: No chief complaint on file.  Visit Diagnoses:  1. Pain in thoracic spine     Plan: For now is a patient with left trapezius and rhomboid type pain.  She took a charge on Friday night in a basketball game.  Did not fall down but had some pain in that trapezial region just medial to the's scapula.  She kept playing in the second half.  Did hurt some immediately.  She is hurt some since Saturday.  She has been able to run and perform in the game on 03/05/2019.  She is okay with shooting.  Taken some ibuprofen.  On exam she has full active and passive range of motion of the shoulders and neck.  No numbness or tingling.  Mild tenderness to palpation but it is a deeper pain around that costochondral junction.  No difficulty with deep breathing but she has mild pain.  She extends her arm and she does not have any pain with deep breathing.  She actually feels little bit better on her back.  She is not had any GI symptoms or heart symptoms.  Impression is probable deep costochondral injury versus other type of pulmonary type contusion.  I would favor observation for now.  I think it should improve on its own.  We can see her back in a week if she is not improving.  No evidence of any type of pneumothorax or solid organ injury based on examination at this time.  Follow-Up Instructions: Return if symptoms worsen or fail to improve.   Orders:  No orders of the defined types were placed in this encounter.  No orders of the defined types were placed in this encounter.   Imaging: No results found.  PMFS History: Patient Active Problem List   Diagnosis Date Noted  . Syncope 05/10/2018   Past Medical History:  Diagnosis Date  . Laryngeal disorder   . SOB (shortness of breath)   . Vaso vagal  episode     Family History  Problem Relation Age of Onset  . Hypotension Mother   . Hypotension Sister     Past Surgical History:  Procedure Laterality Date  . POLYPECTOMY    . TONSILLECTOMY    . TYMPANOSTOMY TUBE PLACEMENT     Social History   Occupational History  . Not on file  Tobacco Use  . Smoking status: Never Smoker  . Smokeless tobacco: Never Used  Substance and Sexual Activity  . Alcohol use: Never  . Drug use: Never  . Sexual activity: Not on file

## 2019-03-10 ENCOUNTER — Other Ambulatory Visit (HOSPITAL_COMMUNITY)
Admission: RE | Admit: 2019-03-10 | Discharge: 2019-03-10 | Disposition: A | Discharge status: Home / Self Care | Payer: HRSA Program | Source: Ambulatory Visit | Attending: Orthopedic Surgery | Admitting: Orthopedic Surgery

## 2019-03-10 DIAGNOSIS — Z01812 Encounter for preprocedural laboratory examination: Secondary | ICD-10-CM | POA: Diagnosis present

## 2019-03-10 DIAGNOSIS — Z20828 Contact with and (suspected) exposure to other viral communicable diseases: Secondary | ICD-10-CM | POA: Diagnosis not present

## 2019-03-10 LAB — SARS CORONAVIRUS 2 (TAT 6-24 HRS): SARS Coronavirus 2: NEGATIVE

## 2019-03-12 ENCOUNTER — Encounter (HOSPITAL_BASED_OUTPATIENT_CLINIC_OR_DEPARTMENT_OTHER): Payer: Self-pay | Admitting: Orthopedic Surgery

## 2019-03-13 ENCOUNTER — Ambulatory Visit (HOSPITAL_BASED_OUTPATIENT_CLINIC_OR_DEPARTMENT_OTHER)
Admission: RE | Admit: 2019-03-13 | Discharge: 2019-03-13 | Disposition: A | Discharge status: Home / Self Care | Payer: BC Managed Care – PPO | Attending: Orthopedic Surgery | Admitting: Orthopedic Surgery

## 2019-03-13 ENCOUNTER — Ambulatory Visit (HOSPITAL_BASED_OUTPATIENT_CLINIC_OR_DEPARTMENT_OTHER): Payer: BC Managed Care – PPO | Admitting: Anesthesiology

## 2019-03-13 ENCOUNTER — Other Ambulatory Visit: Payer: Self-pay

## 2019-03-13 ENCOUNTER — Encounter (HOSPITAL_BASED_OUTPATIENT_CLINIC_OR_DEPARTMENT_OTHER)
Admission: RE | Disposition: A | Discharge status: Home / Self Care | Payer: Self-pay | Source: Home / Self Care | Attending: Orthopedic Surgery

## 2019-03-13 ENCOUNTER — Encounter (HOSPITAL_BASED_OUTPATIENT_CLINIC_OR_DEPARTMENT_OTHER): Payer: Self-pay | Admitting: Orthopedic Surgery

## 2019-03-13 DIAGNOSIS — M25571 Pain in right ankle and joints of right foot: Secondary | ICD-10-CM

## 2019-03-13 DIAGNOSIS — M25871 Other specified joint disorders, right ankle and foot: Secondary | ICD-10-CM

## 2019-03-13 HISTORY — PX: ANKLE ARTHROSCOPY: SHX545

## 2019-03-13 LAB — POCT PREGNANCY, URINE: Preg Test, Ur: NEGATIVE

## 2019-03-13 SURGERY — ARTHROSCOPY, ANKLE
Anesthesia: General | Site: Ankle | Laterality: Right

## 2019-03-13 MED ORDER — HYDROMORPHONE HCL 1 MG/ML IJ SOLN: 0.2500 mg | INTRAMUSCULAR | Status: DC | PRN

## 2019-03-13 MED ORDER — LIDOCAINE 2% (20 MG/ML) 5 ML SYRINGE
INTRAMUSCULAR | Status: AC
  Filled 2019-03-13: qty 5

## 2019-03-13 MED ORDER — FENTANYL CITRATE (PF) 100 MCG/2ML IJ SOLN
50.0000 ug | INTRAMUSCULAR | Status: DC | PRN
  Administered 2019-03-13: 100 ug via INTRAVENOUS
  Administered 2019-03-13: 09:00:00 50 ug via INTRAVENOUS

## 2019-03-13 MED ORDER — CHLORHEXIDINE GLUCONATE 4 % EX LIQD: 60.0000 mL | Freq: Once | CUTANEOUS | Status: DC

## 2019-03-13 MED ORDER — CEFAZOLIN SODIUM-DEXTROSE 2-4 GM/100ML-% IV SOLN
2.0000 g | INTRAVENOUS | Status: AC
  Administered 2019-03-13: 2 g via INTRAVENOUS

## 2019-03-13 MED ORDER — ONDANSETRON HCL 4 MG/2ML IJ SOLN
INTRAMUSCULAR | Status: DC | PRN
  Administered 2019-03-13: 4 mg via INTRAVENOUS

## 2019-03-13 MED ORDER — LIDOCAINE 2% (20 MG/ML) 5 ML SYRINGE
INTRAMUSCULAR | Status: DC | PRN
  Administered 2019-03-13: 100 mg via INTRAVENOUS

## 2019-03-13 MED ORDER — FENTANYL CITRATE (PF) 100 MCG/2ML IJ SOLN
INTRAMUSCULAR | Status: AC
  Filled 2019-03-13: qty 2

## 2019-03-13 MED ORDER — MIDAZOLAM HCL 2 MG/2ML IJ SOLN
1.0000 mg | INTRAMUSCULAR | Status: DC | PRN
  Administered 2019-03-13: 08:00:00 2 mg via INTRAVENOUS

## 2019-03-13 MED ORDER — MEPERIDINE HCL 25 MG/ML IJ SOLN: 6.2500 mg | INTRAMUSCULAR | Status: DC | PRN

## 2019-03-13 MED ORDER — MIDAZOLAM HCL 2 MG/2ML IJ SOLN
INTRAMUSCULAR | Status: AC
  Filled 2019-03-13: qty 2

## 2019-03-13 MED ORDER — ONDANSETRON HCL 4 MG/2ML IJ SOLN: 4.0000 mg | Freq: Once | INTRAMUSCULAR | Status: DC | PRN

## 2019-03-13 MED ORDER — POVIDONE-IODINE 10 % EX SWAB
2.0000 "application " | Freq: Once | CUTANEOUS | Status: AC
  Administered 2019-03-13: 2 via TOPICAL

## 2019-03-13 MED ORDER — PROPOFOL 10 MG/ML IV BOLUS
INTRAVENOUS | Status: AC
  Filled 2019-03-13: qty 20

## 2019-03-13 MED ORDER — LIDOCAINE-EPINEPHRINE (PF) 1.5 %-1:200000 IJ SOLN
INTRAMUSCULAR | Status: DC | PRN
  Administered 2019-03-13: 30 mL via PERINEURAL

## 2019-03-13 MED ORDER — HYDROCODONE-ACETAMINOPHEN 5-325 MG PO TABS: 1.0000 | ORAL_TABLET | ORAL | 0 refills | Status: DC | PRN

## 2019-03-13 MED ORDER — BUPIVACAINE HCL (PF) 0.25 % IJ SOLN
INTRAMUSCULAR | Status: DC | PRN
  Administered 2019-03-13: 20 mL

## 2019-03-13 MED ORDER — PROPOFOL 10 MG/ML IV BOLUS
INTRAVENOUS | Status: DC | PRN
  Administered 2019-03-13: 160 mg via INTRAVENOUS

## 2019-03-13 MED ORDER — BUPIVACAINE-EPINEPHRINE (PF) 0.5% -1:200000 IJ SOLN
INTRAMUSCULAR | Status: DC | PRN
  Administered 2019-03-13: 30 mL via PERINEURAL

## 2019-03-13 MED ORDER — DEXAMETHASONE SODIUM PHOSPHATE 10 MG/ML IJ SOLN
INTRAMUSCULAR | Status: DC | PRN
  Administered 2019-03-13: 10 mg via INTRAVENOUS

## 2019-03-13 MED ORDER — CEFAZOLIN SODIUM-DEXTROSE 2-4 GM/100ML-% IV SOLN
INTRAVENOUS | Status: AC
  Filled 2019-03-13: qty 100

## 2019-03-13 MED ORDER — SODIUM CHLORIDE 0.9 % IR SOLN
Status: DC | PRN
  Administered 2019-03-13: 3000 mL

## 2019-03-13 MED ORDER — LACTATED RINGERS IV SOLN: INTRAVENOUS | Status: DC

## 2019-03-13 SURGICAL SUPPLY — 30 items
BNDG COHESIVE 4X5 TAN STRL (GAUZE/BANDAGES/DRESSINGS) IMPLANT
BNDG COHESIVE 6X5 TAN STRL LF (GAUZE/BANDAGES/DRESSINGS) ×3 IMPLANT
BNDG ESMARK 4X9 LF (GAUZE/BANDAGES/DRESSINGS) ×2 IMPLANT
COVER WAND RF STERILE (DRAPES) IMPLANT
DISSECTOR 4.0MM X 13CM (MISCELLANEOUS) ×3 IMPLANT
DRAPE ARTHROSCOPY W/POUCH 90 (DRAPES) ×3 IMPLANT
DRAPE U-SHAPE 47X51 STRL (DRAPES) ×3 IMPLANT
DRSG EMULSION OIL 3X3 NADH (GAUZE/BANDAGES/DRESSINGS) ×3 IMPLANT
DURAPREP 26ML APPLICATOR (WOUND CARE) ×3 IMPLANT
EXCALIBUR 3.8MM X 13CM (MISCELLANEOUS) ×3 IMPLANT
GAUZE SPONGE 4X4 12PLY STRL (GAUZE/BANDAGES/DRESSINGS) ×3 IMPLANT
GLOVE BIOGEL PI IND STRL 9 (GLOVE) ×1 IMPLANT
GLOVE BIOGEL PI INDICATOR 9 (GLOVE) ×2
GLOVE SURG ORTHO 9.0 STRL STRW (GLOVE) ×3 IMPLANT
GOWN STRL REUS W/ TWL LRG LVL3 (GOWN DISPOSABLE) ×1 IMPLANT
GOWN STRL REUS W/ TWL XL LVL3 (GOWN DISPOSABLE) ×1 IMPLANT
GOWN STRL REUS W/TWL LRG LVL3 (GOWN DISPOSABLE) ×2
GOWN STRL REUS W/TWL XL LVL3 (GOWN DISPOSABLE) ×2
MANIFOLD NEPTUNE II (INSTRUMENTS) ×2 IMPLANT
PACK ARTHROSCOPY DSU (CUSTOM PROCEDURE TRAY) ×3 IMPLANT
PACK BASIN DAY SURGERY FS (CUSTOM PROCEDURE TRAY) ×3 IMPLANT
PORT APPOLLO RF 90DEGREE MULTI (SURGICAL WAND) IMPLANT
PROBE APOLLO 90XL (SURGICAL WAND) ×3 IMPLANT
SLEEVE SCD COMPRESS KNEE MED (MISCELLANEOUS) IMPLANT
STRAP ANKLE FOOT DISTRACTOR (ORTHOPEDIC SUPPLIES) ×3 IMPLANT
SUT ETHILON 2 0 FSLX (SUTURE) ×3 IMPLANT
SYR 5ML LL (SYRINGE) IMPLANT
TOWEL GREEN STERILE FF (TOWEL DISPOSABLE) ×3 IMPLANT
TUBING ARTHROSCOPY IRRIG 16FT (MISCELLANEOUS) ×3 IMPLANT
WATER STERILE IRR 1000ML POUR (IV SOLUTION) ×3 IMPLANT

## 2019-03-13 NOTE — Anesthesia Preprocedure Evaluation (Signed)
Anesthesia Evaluation  Patient identified by MRN, date of birth, ID band Patient awake    Reviewed: Allergy & Precautions, NPO status , Patient's Chart, lab work & pertinent test results  Airway Mallampati: I  TM Distance: >3 FB Neck ROM: Full    Dental   Pulmonary    Pulmonary exam normal        Cardiovascular Normal cardiovascular exam     Neuro/Psych    GI/Hepatic   Endo/Other    Renal/GU      Musculoskeletal   Abdominal   Peds  Hematology   Anesthesia Other Findings   Reproductive/Obstetrics                             Anesthesia Physical Anesthesia Plan  ASA: II  Anesthesia Plan: General   Post-op Pain Management:  Regional for Post-op pain   Induction:   PONV Risk Score and Plan: 3 and Ondansetron, Midazolam and Dexamethasone  Airway Management Planned: LMA  Additional Equipment:   Intra-op Plan:   Post-operative Plan: Extubation in OR  Informed Consent: I have reviewed the patients History and Physical, chart, labs and discussed the procedure including the risks, benefits and alternatives for the proposed anesthesia with the patient or authorized representative who has indicated his/her understanding and acceptance.       Plan Discussed with: CRNA and Surgeon  Anesthesia Plan Comments:         Anesthesia Quick Evaluation

## 2019-03-13 NOTE — H&P (Signed)
Hannah Bush is an 23 y.o. female.   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  Patient complains of stiffness stinging and burning.  Patient has had MRI scan that showed no articular cartilage defects no ligamentous instability no tendon defect.  Past Medical History:  Diagnosis Date  . Laryngeal disorder   . SOB (shortness of breath)   . Vaso vagal episode     Past Surgical History:  Procedure Laterality Date  . POLYPECTOMY    . TONSILLECTOMY    . TYMPANOSTOMY TUBE PLACEMENT      Family History  Problem Relation Age of Onset  . Hypotension Mother   . Hypotension Sister    Social History:  reports that she has never smoked. She has never used smokeless tobacco. She reports that she does not drink alcohol or use drugs.  Allergies: No Known Allergies  No medications prior to admission.    No results found for this or any previous visit (from the past 48 hour(s)). No results found.  Review of Systems  All other systems reviewed and are negative.   Height 6' (1.829 m), weight 81.6 kg, last menstrual period 03/01/2019. Physical Exam  Patient is alert, oriented, no adenopathy, well-dressed, normal affect, normal respiratory effort. Examination patient does have swelling over the anterior medial joint line.  Anterior drawer is stable no ligamentous instability she has no tenderness to palpation over the lateral ankle ligaments or over the deltoid.  Her tendons are nontender to palpation she can do a single limb heel raise and recreates good hindfoot varus with a single limb heel raise no posterior tibial tendon insufficiency however on the right foot she does have increased valgus of the right hindfoot compared to the left hindfoot. Assessment/Plan 1. Impingement of  right ankle joint     Plan: Discussed with patient options including the possibility of arthroscopic debridement.  Patient states she would like to proceed with arthroscopy risk and benefits were discussed including persistent pain need for additional surgery.  Patient states she understands wishes to proceed.  Anticipate she would be out of competition for at least 6 weeks   Melissa, PA 03/13/2019, 6:56 AM

## 2019-03-13 NOTE — Op Note (Signed)
03/13/2019  9:23 AM  PATIENT:  Hannah Bush    PRE-OPERATIVE DIAGNOSIS:  Impingement Right Ankle  POST-OPERATIVE DIAGNOSIS:  Same  PROCEDURE:  RIGHT ANKLE ARTHROSCOPY AND DEBRIDEMENT  SURGEON:  Newt Minion, MD  PHYSICIAN ASSISTANT:None ANESTHESIA:   General  PREOPERATIVE INDICATIONS:  [name] is a  23 y.o. female with a diagnosis of Impingement Right Ankle who failed conservative measures and elected for surgical management.    The risks benefits and alternatives were discussed with the patient preoperatively including but not limited to the risks of infection, bleeding, nerve injury, cardiopulmonary complications, the need for revision surgery, among others, and the patient was willing to proceed.  OPERATIVE IMPLANTS: None  @ENCIMAGES @  OPERATIVE FINDINGS: Articular cartilage intact no osteochondral defects impingement anteriorly as well as the medial and lateral gutters.  OPERATIVE PROCEDURE: Patient was brought the operating room after undergoing a block she then underwent a general anesthetic.  After adequate levels anesthesia were obtained patient's right lower extremity was prepped using DuraPrep draped into a sterile field a timeout was called.  The scope was inserted from the anterior medial portal anterior lateral portal was established as a working portal.  Portals were established with an 18-gauge spinal skin was incised blunt dissection was carried down to the retinaculum and a blunt trocar was used inserted into the joint.  Visualization showed extensive and the electrical wand was used for hemostasis.  Examination after debridement the medial and lateral gutters were cleared these were probed with no loose bodies.  The tibial talar joint was also probed there was no soft spots on the cartilage no osteochondral defect the joint space was congruent.  The instruments were removed the portals were closed using 2-0 nylon a sterile dressing was applied  patient was extubated taken the PACU in stable condition.  Anteriorly as well as the medial lateral gutters.  The shaver was used   DISCHARGE PLANNING:  Antibiotic duration: Preoperative antibiotics  Weightbearing: Weightbearing as tolerated  Pain medication: Prescription for Vicodin  Dressing care/ Wound VAC: Discontinue dressing in 2 days  Ambulatory devices: Crutches  Discharge to: Home.  Follow-up: In the office 1 week post operative.

## 2019-03-13 NOTE — Progress Notes (Signed)
AssistedDr. Ossey with right, ultrasound guided, adductor canal block. Side rails up, monitors on throughout procedure. See vital signs in flow sheet. Tolerated Procedure well.  

## 2019-03-13 NOTE — Transfer of Care (Signed)
Immediate Anesthesia Transfer of Care Note  Patient: Hannah Bush  Procedure(s) Performed: RIGHT ANKLE ARTHROSCOPY AND DEBRIDEMENT (Right Ankle)  Patient Location: PACU  Anesthesia Type:General and Regional  Level of Consciousness: drowsy  Airway & Oxygen Therapy: Patient Spontanous Breathing and Patient connected to face mask oxygen  Post-op Assessment: Report given to RN and Post -op Vital signs reviewed and stable  Post vital signs: Reviewed and stable  Last Vitals:  Vitals Value Taken Time  BP    Temp    Pulse 59 03/13/19 0928  Resp 11 03/13/19 0928  SpO2 100 % 03/13/19 0928  Vitals shown include unvalidated device data.  Last Pain:  Vitals:   03/13/19 0736  TempSrc: Temporal  PainSc: 0-No pain      Patients Stated Pain Goal: 5 (70/26/37 8588)  Complications: No apparent anesthesia complications

## 2019-03-13 NOTE — Anesthesia Procedure Notes (Signed)
Anesthesia Regional Block: Popliteal block   Pre-Anesthetic Checklist: ,, timeout performed, Correct Patient, Correct Site, Correct Laterality, Correct Procedure, Correct Position, site marked, Risks and benefits discussed,  Surgical consent,  Pre-op evaluation,  At surgeon's request and post-op pain management  Laterality: Right  Prep: chloraprep       Needles:  Injection technique: Single-shot  Needle Type: Echogenic Stimulator Needle     Needle Length: 10cm  Needle Gauge: 21     Additional Needles:   Procedures:, nerve stimulator,,,,,,,   Nerve Stimulator or Paresthesia:  Response: 0.4 mA,   Additional Responses:   Narrative:  Start time: 03/13/2019 8:08 AM End time: 03/13/2019 8:18 AM Injection made incrementally with aspirations every 5 mL.  Performed by: Personally  Anesthesiologist: Lillia Abed, MD  Additional Notes: Monitors applied. Patient sedated. Sterile prep and drape,hand hygiene and sterile gloves were used. Relevant anatomy identified.Needle position confirmed.Local anesthetic injected incrementally after negative aspiration. Local anesthetic spread visualized around nerve(s). Vascular puncture avoided. No complications. Image printed for medical record.The patient tolerated the procedure well.  Additional Saphenous nerve block performed. 15cc Local Anesthetic mixture placed under ultrasonic guidance along the medio-inferior border of the Sartorious muscle 6 inches above the knee.  No Problems encountered.  Lillia Abed MD

## 2019-03-13 NOTE — Anesthesia Procedure Notes (Signed)
Procedure Name: LMA Insertion Date/Time: 03/13/2019 8:53 AM Performed by: Gwyndolyn Saxon, CRNA Pre-anesthesia Checklist: Patient identified, Emergency Drugs available, Suction available and Patient being monitored Patient Re-evaluated:Patient Re-evaluated prior to induction Oxygen Delivery Method: Circle System Utilized Preoxygenation: Pre-oxygenation with 100% oxygen Induction Type: IV induction Ventilation: Mask ventilation without difficulty LMA: LMA inserted LMA Size: 4.0 Number of attempts: 1 Placement Confirmation: positive ETCO2 Tube secured with: Tape Dental Injury: Teeth and Oropharynx as per pre-operative assessment

## 2019-03-13 NOTE — Anesthesia Postprocedure Evaluation (Signed)
Anesthesia Post Note  Patient: Hannah Bush  Procedure(s) Performed: RIGHT ANKLE ARTHROSCOPY AND DEBRIDEMENT (Right Ankle)     Patient location during evaluation: PACU Anesthesia Type: General Level of consciousness: awake and alert Pain management: pain level controlled Vital Signs Assessment: post-procedure vital signs reviewed and stable Respiratory status: spontaneous breathing, nonlabored ventilation, respiratory function stable and patient connected to nasal cannula oxygen Cardiovascular status: blood pressure returned to baseline and stable Postop Assessment: no apparent nausea or vomiting Anesthetic complications: no    Last Vitals:  Vitals:   03/13/19 1000 03/13/19 1043  BP: 130/80 135/70  Pulse: 68 84  Resp: 11 16  Temp:  36.8 C  SpO2: 100% 100%    Last Pain:  Vitals:   03/13/19 1043  TempSrc:   PainSc: 0-No pain                 Shakera Ebrahimi DAVID

## 2019-03-13 NOTE — Discharge Instructions (Signed)
Regional Anesthesia Blocks ? ?1. Numbness or the inability to move the "blocked" extremity may last from 3-48 hours after placement. The length of time depends on the medication injected and your individual response to the medication. If the numbness is not going away after 48 hours, call your surgeon. ? ?2. The extremity that is blocked will need to be protected until the numbness is gone and the  Strength has returned. Because you cannot feel it, you will need to take extra care to avoid injury. Because it may be weak, you may have difficulty moving it or using it. You may not know what position it is in without looking at it while the block is in effect. ? ?3. For blocks in the legs and feet, returning to weight bearing and walking needs to be done carefully. You will need to wait until the numbness is entirely gone and the strength has returned. You should be able to move your leg and foot normally before you try and bear weight or walk. You will need someone to be with you when you first try to ensure you do not fall and possibly risk injury. ? ?4. Bruising and tenderness at the needle site are common side effects and will resolve in a few days. ? ?5. Persistent numbness or new problems with movement should be communicated to the surgeon or the [facility] ([phone])/ [facility] ([phone]).  ? ?Post Anesthesia Home Care Instructions ? ?Activity: ?Get plenty of rest for the remainder of the day. A responsible individual must stay with you for 24 hours following the procedure.  ?For the next 24 hours, DO NOT: ?-Drive a car ?-Operate machinery ?-Drink alcoholic beverages ?-Take any medication unless instructed by your physician ?-Make any legal decisions or sign important papers. ? ?Meals: ?Start with liquid foods such as gelatin or soup. Progress to regular foods as tolerated. Avoid greasy, spicy, heavy foods. If nausea and/or vomiting occur, drink only clear liquids until the  nausea and/or vomiting subsides. Call your physician if vomiting continues. ? ?Special Instructions/Symptoms: ?Your throat may feel dry or sore from the anesthesia or the breathing tube placed in your throat during surgery. If this causes discomfort, gargle with warm salt water. The discomfort should disappear within 24 hours. ? ?If you had a scopolamine patch placed behind your ear for the management of post- operative nausea and/or vomiting: ? ?1. The medication in the patch is effective for 72 hours, after which it should be removed.  Wrap patch in a tissue and discard in the trash. Wash hands thoroughly with soap and water. ?2. You may remove the patch earlier than 72 hours if you experience unpleasant side effects which may include dry mouth, dizziness or visual disturbances. ?3. Avoid touching the patch. Wash your hands with soap and water after contact with the patch. ?    ?

## 2019-03-14 ENCOUNTER — Encounter: Payer: Self-pay | Admitting: *Deleted

## 2019-03-22 ENCOUNTER — Other Ambulatory Visit: Payer: Self-pay

## 2019-03-22 ENCOUNTER — Ambulatory Visit (INDEPENDENT_AMBULATORY_CARE_PROVIDER_SITE_OTHER): Payer: BC Managed Care – PPO | Admitting: Orthopedic Surgery

## 2019-03-22 ENCOUNTER — Encounter: Payer: Self-pay | Admitting: Orthopedic Surgery

## 2019-03-22 VITALS — Ht 72.0 in | Wt 189.6 lb

## 2019-03-22 DIAGNOSIS — M25871 Other specified joint disorders, right ankle and foot: Secondary | ICD-10-CM

## 2019-04-02 ENCOUNTER — Encounter: Payer: Self-pay | Admitting: Orthopedic Surgery

## 2019-04-02 NOTE — Progress Notes (Signed)
   Office Visit Note   Patient: Tierra Thoma           Date of Birth: 01-12-1996           MRN: [id number] Visit Date: 03/22/2019              Requested by: No referring provider defined for this encounter. PCP: Patient, No Pcp Per  Chief Complaint  Patient presents with  . 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  Patient states that she is doing well has no complaints no pain.  Assessment & Plan: Visit Diagnoses:  1. Impingement of right ankle joint     Plan: Patient will work with the trainers for strengthening plantar fascia strengthening proprioception increase her activities as tolerated.  Follow-Up Instructions: Return if symptoms worsen or fail to improve.   Ortho Exam  Patient is alert, oriented, no adenopathy, well-dressed, normal affect, normal respiratory effort. Examination patient's right ankle is asymptomatic after arthroscopic debridement sutures are harvested her ankle is stable there is no redness no cellulitis no signs of infection.  Imaging: No results found. No images are attached to the encounter.  Labs: No results found for: HGBA1C, ESRSEDRATE, CRP, LABURIC, REPTSTATUS, GRAMSTAIN, CULT, LABORGA   No results found for: ALBUMIN, PREALBUMIN, LABURIC  No results found for: MG No results found for: VD25OH  No results found for: PREALBUMIN CBC EXTENDED Latest Ref Rng & Units 05/01/2018  WBC 4.0 - 10.5 K/uL 5.3  RBC 3.87 - 5.11 MIL/uL 4.52  HGB 12.0 - 15.0 g/dL 66.4  HCT 40.3 - 47.4 % 42.2  PLT 150 - 400 K/uL 222     Body mass index is 25.71 kg/m.  Orders:  No orders of the defined types were placed in this encounter.  No orders of the defined types were placed in this encounter.    Procedures: No procedures performed  Clinical Data: No additional findings.  ROS:  All other systems  negative, except as noted in the HPI. Review of Systems  Objective: Vital Signs: Ht 6' (1.829 m)   Wt 189 lb 9.6 oz (86 kg)   LMP 03/01/2019 (Exact Date)   BMI 25.71 kg/m   Specialty Comments:  No specialty comments available.  PMFS History: Patient Active Problem List   Diagnosis Date Noted  . Syncope 05/10/2018   Past Medical History:  Diagnosis Date  . Laryngeal disorder   . SOB (shortness of breath)   . Vaso vagal episode     Family History  Problem Relation Age of Onset  . Hypotension Mother   . Hypotension Sister     Past Surgical History:  Procedure Laterality Date  . ANKLE ARTHROSCOPY Right 03/13/2019   Procedure: RIGHT ANKLE ARTHROSCOPY AND DEBRIDEMENT;  Surgeon: Nadara Mustard, MD;  Location: [facility];  Service: Orthopedics;  Laterality: Right;  . POLYPECTOMY    . TONSILLECTOMY    . TYMPANOSTOMY TUBE PLACEMENT     Social History   Occupational History  . Not on file  Tobacco Use  . Smoking status: Never Smoker  . Smokeless tobacco: Never Used  Substance and Sexual Activity  . Alcohol use: Never  . Drug use: Never  . Sexual activity: Not on file

## 2019-04-11 ENCOUNTER — Other Ambulatory Visit: Payer: Self-pay | Admitting: Radiology

## 2019-04-11 MED ORDER — DICLOFENAC EPOLAMINE 1.3 % EX PTCH: MEDICATED_PATCH | CUTANEOUS | 0 refills | Status: DC

## 2019-04-12 ENCOUNTER — Other Ambulatory Visit: Payer: Self-pay

## 2019-04-16 ENCOUNTER — Other Ambulatory Visit: Payer: Self-pay

## 2019-04-16 MED ORDER — DICLOFENAC EPOLAMINE 1.3 % EX PTCH: 1.0000 | MEDICATED_PATCH | Freq: Two times a day (BID) | CUTANEOUS | 1 refills | Status: DC

## 2019-04-16 NOTE — Progress Notes (Signed)
Patient stated rx not received at pharmacy. I have resubmitted

## 2019-04-23 ENCOUNTER — Telehealth: Payer: Self-pay | Admitting: Orthopedic Surgery

## 2019-04-23 NOTE — Telephone Encounter (Signed)
This pt is s/p a right ankle debridement 03/13/19 see message below. Did you give a and written rx to this pt recently? 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 Pharm said it was for a flector patch and would need you to send inanother rx because it did not have your signature. ( of note once they ran this through insurance it was over 200$ out of pocket for the pt.

## 2019-04-23 NOTE — Telephone Encounter (Signed)
[facility] on [address] called.   They stated that the patient brought in a prescription from our office but didn't have the signature or name of any doctor.   In addition, this pharmacy is not the one listed in the patient's chart.   Pharmacy call back number: [phone]

## 2019-04-23 NOTE — Telephone Encounter (Signed)
Tried to call pt no answer will hold message and try again later.

## 2019-04-23 NOTE — Telephone Encounter (Signed)
Call patient and see if she wants a prescription for the Flector patch we can call this into a pharmacy of her choice.

## 2019-04-23 NOTE — Telephone Encounter (Signed)
Per Dr. Lajoyce Corners due to the high cost of flector patch to tell the pt to use OTC Voltaren gel. I will call pt this afternoon to advise.

## 2019-04-26 DIAGNOSIS — M25871 Other specified joint disorders, right ankle and foot: Secondary | ICD-10-CM

## 2019-04-27 NOTE — Telephone Encounter (Signed)
I tried to reach pt again and no answer after multiple rings and no ability to leave a voicemail. Will try again.
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
## 2019-05-04 ENCOUNTER — Telehealth: Payer: Self-pay | Admitting: Cardiology

## 2019-05-04 ENCOUNTER — Other Ambulatory Visit: Payer: Self-pay

## 2019-05-04 ENCOUNTER — Telehealth: Payer: Self-pay

## 2019-05-04 ENCOUNTER — Telehealth (INDEPENDENT_AMBULATORY_CARE_PROVIDER_SITE_OTHER): Payer: BC Managed Care – PPO | Admitting: Cardiology

## 2019-05-04 VITALS — Ht 72.0 in | Wt 180.0 lb

## 2019-05-04 DIAGNOSIS — R55 Syncope and collapse: Secondary | ICD-10-CM

## 2019-05-04 MED ORDER — SERTRALINE HCL 25 MG PO TABS: 25.0000 mg | ORAL_TABLET | Freq: Every day | ORAL | 3 refills | Status: DC

## 2019-05-04 NOTE — Telephone Encounter (Signed)
Tanya Nones is calling from [employer] requesting the patients most recent office notes. They can be faxed over to the number [phone].

## 2019-05-04 NOTE — Patient Instructions (Signed)
Medication Instructions:  Your physician has recommended you make the following change in your medication:  1) START taking Zoloft (sertraline) 25 mg daily   *If you need a refill on your cardiac medications before your next appointment, please call your pharmacy*  Testing/Procedures: Your physician has requested that you have a cardiac MRI. Cardiac MRI uses a computer to create images of your heart as its beating, producing both still and moving pictures of your heart and major blood vessels. For further information please visit [email or web address]. Please follow the instruction sheet given to you today for more information.  Follow-Up: At [facility], you and your health needs are our priority.  As part of our continuing mission to provide you with exceptional heart care, we have created designated Provider Care Teams.  These Care Teams include your primary Cardiologist (physician) and Advanced Practice Providers (APPs -  Physician Assistants and Nurse Practitioners) who all work together to provide you with the care you need, when you need it.  Your next appointment:   March 23rd, 2021 at 8:20AM  The format for your next appointment:   Virtual Visit   Provider:   Armanda Magic, MD

## 2019-05-04 NOTE — Telephone Encounter (Signed)
° ° °YOUR CARDIOLOGY TEAM HAS ARRANGED FOR AN E-VISIT FOR YOUR APPOINTMENT - PLEASE REVIEW IMPORTANT INFORMATION BELOW SEVERAL DAYS PRIOR TO YOUR APPOINTMENT ° °Due to the recent COVID-19 pandemic, we are transitioning in-person office visits to tele-medicine visits in an effort to decrease unnecessary exposure to our patients, their families, and staff. These visits are billed to your insurance just like a normal visit is. We also encourage you to sign up for MyChart if you have not already done so. You will need a smartphone if possible. For patients that do not have this, we can still complete the visit using a regular telephone but do prefer a smartphone to enable video when possible. You may have a family member that lives with you that can help. If possible, we also ask that you have a blood pressure cuff and scale at home to measure your blood pressure, heart rate and weight prior to your scheduled appointment. Patients with clinical needs that need an in-person evaluation and testing will still be able to come to the office if absolutely necessary. If you have any questions, feel free to call our office. ° ° ° °THE DAY OF YOUR APPOINTMENT ° °Approximately 15 minutes prior to your scheduled appointment, you will receive a telephone call from one of [facility] team - your caller ID may say "Unknown caller."  Our staff will confirm medications, vital signs for the day and any symptoms you may be experiencing. Please have this information available prior to the time of visit start. It may also be helpful for you to have a pad of paper and pen handy for any instructions given during your visit. They will also walk you through joining the smartphone meeting if this is a video visit. ° ° ° °CONSENT FOR TELE-HEALTH VISIT - PLEASE REVIEW ° °I hereby voluntarily request, consent and authorize [facility] and its employed or contracted physicians, physician assistants, nurse practitioners or other licensed health  care professionals (the Practitioner), to provide me with telemedicine health care services (the “Services") as deemed necessary by the treating Practitioner. I acknowledge and consent to receive the Services by the Practitioner via telemedicine. I understand that the telemedicine visit will involve communicating with the Practitioner through live audiovisual communication technology and the disclosure of certain medical information by electronic transmission. I acknowledge that I have been given the opportunity to request an in-person assessment or other available alternative prior to the telemedicine visit and am voluntarily participating in the telemedicine visit. ° °I understand that I have the right to withhold or withdraw my consent to the use of telemedicine in the course of my care at any time, without affecting my right to future care or treatment, and that the Practitioner or I may terminate the telemedicine visit at any time. I understand that I have the right to inspect all information obtained and/or recorded in the course of the telemedicine visit and may receive copies of available information for a reasonable fee.  I understand that some of the potential risks of receiving the Services via telemedicine include:  °• Delay or interruption in medical evaluation due to technological equipment failure or disruption; °• Information transmitted may not be sufficient (e.g. poor resolution of images) to allow for appropriate medical decision making by the Practitioner; and/or  °• In rare instances, security protocols could fail, causing a breach of personal health information. ° °Furthermore, I acknowledge that it is my responsibility to provide information about my medical history, conditions and care that   is complete and accurate to the best of my ability. I acknowledge that Practitioner's advice, recommendations, and/or decision may be based on factors not within their control, such as incomplete or  inaccurate data provided by me or distortions of diagnostic images or specimens that may result from electronic transmissions. I understand that the practice of medicine is not an exact science and that Practitioner makes no warranties or guarantees regarding treatment outcomes. I acknowledge that I will receive a copy of this consent concurrently upon execution via email to the email address I last provided but may also request a printed copy by calling the office of [facility].   ° °I understand that my insurance will be billed for this visit.  ° °I have read or had this consent read to me. °• I understand the contents of this consent, which adequately explains the benefits and risks of the Services being provided via telemedicine.  °• I have been provided ample opportunity to ask questions regarding this consent and the Services and have had my questions answered to my satisfaction. °• I give my informed consent for the services to be provided through the use of telemedicine in my medical care ° °By participating in this telemedicine visit I agree to the above. ° °

## 2019-05-07 ENCOUNTER — Telehealth: Payer: Self-pay | Admitting: Cardiology

## 2019-05-07 MED ORDER — SERTRALINE HCL 25 MG PO TABS: 25.0000 mg | ORAL_TABLET | Freq: Every day | ORAL | 3 refills | Status: AC

## 2019-05-07 NOTE — Telephone Encounter (Signed)
Spoke with patient regarding the prior authorization needed to schedule her Cardiac MRI----patient states he medication from Friday's visit has notbeen called in

## 2019-05-07 NOTE — Telephone Encounter (Signed)
The patient's Zoloft was called in to the wrong pharmacy. Per patient request, called in rx to [facility]. She was grateful for assistance.

## 2019-05-18 ENCOUNTER — Telehealth: Payer: Self-pay | Admitting: *Deleted

## 2019-05-18 ENCOUNTER — Encounter: Payer: Self-pay | Admitting: Cardiology

## 2019-05-18 NOTE — Telephone Encounter (Signed)
Attempted to call patient regarding appointment for Cardiac MRI scheduled 06/06/19 at 12:00pm at [facility]---arrival time is 11:5 am 1st floor admission office---will mail information to patient and it is also in My Chart

## 2019-06-05 ENCOUNTER — Encounter (HOSPITAL_COMMUNITY): Payer: Self-pay

## 2019-06-06 ENCOUNTER — Ambulatory Visit (HOSPITAL_COMMUNITY)
Admission: RE | Admit: 2019-06-06 | Discharge: 2019-06-06 | Disposition: A | Discharge status: Home / Self Care | Payer: BLUE CROSS/BLUE SHIELD | Source: Ambulatory Visit | Attending: Cardiology | Admitting: Cardiology

## 2019-06-06 ENCOUNTER — Other Ambulatory Visit: Payer: Self-pay

## 2019-06-06 DIAGNOSIS — R55 Syncope and collapse: Secondary | ICD-10-CM | POA: Diagnosis present

## 2019-06-06 MED ORDER — GADOBUTROL 1 MMOL/ML IV SOLN
10.0000 mL | Freq: Once | INTRAVENOUS | Status: AC | PRN
  Administered 2019-06-06: 10 mL via INTRAVENOUS
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
## 2019-06-12 ENCOUNTER — Telehealth: Payer: BC Managed Care – PPO | Admitting: Cardiology

## 2019-06-12 ENCOUNTER — Telehealth (INDEPENDENT_AMBULATORY_CARE_PROVIDER_SITE_OTHER): Payer: BLUE CROSS/BLUE SHIELD | Admitting: Cardiology

## 2019-06-12 ENCOUNTER — Encounter: Payer: Self-pay | Admitting: Cardiology

## 2019-06-12 ENCOUNTER — Other Ambulatory Visit: Payer: Self-pay

## 2019-06-12 VITALS — Ht 72.0 in | Wt 180.0 lb

## 2019-06-12 DIAGNOSIS — R55 Syncope and collapse: Secondary | ICD-10-CM
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
## 2019-07-16 ENCOUNTER — Other Ambulatory Visit: Payer: Self-pay

## 2019-07-16 ENCOUNTER — Encounter: Payer: Self-pay | Admitting: Cardiology

## 2019-07-16 ENCOUNTER — Telehealth (INDEPENDENT_AMBULATORY_CARE_PROVIDER_SITE_OTHER): Payer: BLUE CROSS/BLUE SHIELD | Admitting: Cardiology

## 2019-07-16 VITALS — Ht 72.0 in | Wt 185.0 lb

## 2019-07-16 DIAGNOSIS — R55 Syncope and collapse: Secondary | ICD-10-CM

## 2020-01-14 ENCOUNTER — Ambulatory Visit (INDEPENDENT_AMBULATORY_CARE_PROVIDER_SITE_OTHER): Payer: Self-pay | Admitting: Orthopedic Surgery

## 2020-01-14 DIAGNOSIS — M25561 Pain in right knee: Secondary | ICD-10-CM

## 2020-01-16 ENCOUNTER — Other Ambulatory Visit: Payer: Self-pay

## 2020-01-19 ENCOUNTER — Encounter: Payer: Self-pay | Admitting: Orthopedic Surgery

## 2020-01-19 NOTE — Progress Notes (Signed)
   Post-Op Visit Note   Patient: Hannah Bush           Date of Birth: [birth year]           MRN: [id number] Visit Date: 01/14/2020 PCP: Default, Provider, MD   Assessment & Plan:  Chief Complaint: No chief complaint on file.  Visit Diagnoses:  1. Acute pain of right knee     Plan: Patient presents with right knee pain.  She sustained a valgus force to the right knee on 1016.  Captured on video which is reviewed.  She was actually okay to run afterwards.  Lateral side felt okay.  Reports no pain medially even though this was a valgus force.  When she does forward and back motion she is okay but lateral motion does bother her some.  She has been doing phonophoresis daily.  No over-the-counter medication after the first 3 days.  She gets a little sore after practice.  On exam she has some lateral joint line tenderness but no effusion and no tenderness over the MCL.  Range of motion is full.  Collateral and cruciate ligaments are stable.  Fluoroscopic radiographs demonstrate no acute fracture and maintained joint space.  Impression is valgus injury with some lateral sided pain which could be muscle strain capsular irritation lateral meniscal tear possible but less likely with no mechanical symptoms and no effusion currently.  Plan is to progress with rehabilitation until she develops effusion or mechanical symptoms at which time we could scan the knee.  Follow-up in the training room if she is not progressing.  Follow-Up Instructions: Return if symptoms worsen or fail to improve.   Orders:  No orders of the defined types were placed in this encounter.  No orders of the defined types were placed in this encounter.   Imaging: No results found.  PMFS History: Patient Active Problem List   Diagnosis Date Noted  . Ankle impingement syndrome, right   . Syncope 05/10/2018   Past Medical History:  Diagnosis Date  . Ankle impingement syndrome, right   . Laryngeal disorder   .  SOB (shortness of breath)   . Syncope 05/10/2018  . Vasovagal syncope     Family History  Problem Relation Age of Onset  . Hypotension Mother   . Hypotension Sister     Past Surgical History:  Procedure Laterality Date  . ANKLE ARTHROSCOPY Right 03/13/2019   Procedure: RIGHT ANKLE ARTHROSCOPY AND DEBRIDEMENT;  Surgeon: Nadara Mustard, MD;  Location: [facility];  Service: Orthopedics;  Laterality: Right;  . POLYPECTOMY    . TONSILLECTOMY    . TYMPANOSTOMY TUBE PLACEMENT     Social History   Occupational History  . Not on file  Tobacco Use  . Smoking status: Never Smoker  . Smokeless tobacco: Never Used  Vaping Use  . Vaping Use: Never used  Substance and Sexual Activity  . Alcohol use: Never  . Drug use: Never  . Sexual activity: Not on file

## 2020-01-24 ENCOUNTER — Other Ambulatory Visit: Payer: Self-pay | Admitting: Orthopedic Surgery

## 2020-01-24 DIAGNOSIS — M25561 Pain in right knee: Secondary | ICD-10-CM

## 2020-01-25 ENCOUNTER — Ambulatory Visit: Payer: BLUE CROSS/BLUE SHIELD | Admitting: Orthopedic Surgery

## 2020-02-27 ENCOUNTER — Encounter: Payer: Self-pay | Admitting: Family

## 2020-02-27 ENCOUNTER — Ambulatory Visit (INDEPENDENT_AMBULATORY_CARE_PROVIDER_SITE_OTHER): Payer: Commercial Managed Care - PPO | Admitting: Family

## 2020-02-27 DIAGNOSIS — M25571 Pain in right ankle and joints of right foot: Secondary | ICD-10-CM

## 2020-02-27 NOTE — Progress Notes (Signed)
Office Visit Note   Patient: Hannah Bush           Date of Birth: [birth year]           MRN: [id number] Visit Date: 02/27/2020              Requested by: No referring provider defined for this encounter. PCP: Default, Provider, MD  No chief complaint on file.     HPI: The patient is a 24 year old woman who is seen today for evaluation of right ankle pain.  She is a Building services engineer at [employer].  States in the last few days she has had return of pain to the medial ankle as well as some over the anterior joint line on the right.  This is associated with mild swelling pain made worse by her hightop shoe wear during basketball.  She has not had any associated injuries that she or her trainer can recall trainer reports they have tried placing felt pressure relieving donuts in her shoe wear to relieve the pain over her medial malleolus.  This has been mildly helpful.    Reports radiographs were performed in the training room.  Radiographs were reviewed today in the office independently by myself.  There is no acute Fracture the mortise is congruent.  She does have a small calcification over the medial malleolus this is consistent with her area of tenderness  Of note she does have an MRI scan of her right ankle on file this was from about 1 year ago she also had a arthroscopy with debridement of the right ankle in December of last year this was unrevealing  Assessment & Plan: Visit Diagnoses: No diagnosis found.  Plan: Discussed case with Dr. August Saucer.  We will proceed with MRI of the right ankle she will follow-up with Dr. Lajoyce Corners in the office after her MRI scan.  In the interim she will continue with her anti-inflammatories recommended Aleve twice daily for the next 2 weeks  Follow-Up Instructions: Return c duda.   Right Ankle Exam   Tenderness  The patient is experiencing tenderness in the medial malleolus (sinus tarsi). Swelling: mild  Range of Motion  The patient has normal  right ankle ROM.  Muscle Strength  The patient has normal right ankle strength.  Tests  Anterior drawer: negative Varus tilt: negative  Other  Erythema: absent   Comments:  Well healed surgical portals      Patient is alert, oriented, no adenopathy, well-dressed, normal affect, normal respiratory effort.   Imaging: No results found. No images are attached to the encounter.  Labs: No results found for: HGBA1C, ESRSEDRATE, CRP, LABURIC, REPTSTATUS, GRAMSTAIN, CULT, LABORGA   No results found for: ALBUMIN, PREALBUMIN, LABURIC  No results found for: MG No results found for: VD25OH  No results found for: PREALBUMIN CBC EXTENDED Latest Ref Rng & Units 05/01/2018  WBC 4.0 - 10.5 K/uL 5.3  RBC 3.87 - 5.11 MIL/uL 4.52  HGB 12.0 - 15.0 g/dL 82.9  HCT 36 - 46 % 56.2  PLT 150 - 400 K/uL 222     There is no height or weight on file to calculate BMI.  Orders:  No orders of the defined types were placed in this encounter.  No orders of the defined types were placed in this encounter.    Procedures: No procedures performed  Clinical Data: No additional findings.  ROS:  All other systems negative, except as noted in the HPI. Review of Systems  Objective: Vital Signs:  There were no vitals taken for this visit.  Specialty Comments:  No specialty comments available.  PMFS History: Patient Active Problem List   Diagnosis Date Noted  . Ankle impingement syndrome, right   . Syncope 05/10/2018   Past Medical History:  Diagnosis Date  . Ankle impingement syndrome, right   . Laryngeal disorder   . SOB (shortness of breath)   . Syncope 05/10/2018  . Vasovagal syncope     Family History  Problem Relation Age of Onset  . Hypotension Mother   . Hypotension Sister     Past Surgical History:  Procedure Laterality Date  . ANKLE ARTHROSCOPY Right 03/13/2019   Procedure: RIGHT ANKLE ARTHROSCOPY AND DEBRIDEMENT;  Surgeon: Nadara Mustard, MD;  Location: [facility];  Service: Orthopedics;  Laterality: Right;  . POLYPECTOMY    . TONSILLECTOMY    . TYMPANOSTOMY TUBE PLACEMENT     Social History   Occupational History  . Not on file  Tobacco Use  . Smoking status: Never Smoker  . Smokeless tobacco: Never Used  Vaping Use  . Vaping Use: Never used  Substance and Sexual Activity  . Alcohol use: Never  . Drug use: Never  . Sexual activity: Not on file

## 2020-03-10 ENCOUNTER — Ambulatory Visit (INDEPENDENT_AMBULATORY_CARE_PROVIDER_SITE_OTHER): Payer: Commercial Managed Care - PPO | Admitting: Orthopedic Surgery

## 2020-03-10 ENCOUNTER — Encounter: Payer: Self-pay | Admitting: Orthopedic Surgery

## 2020-03-10 DIAGNOSIS — M25571 Pain in right ankle and joints of right foot: Secondary | ICD-10-CM

## 2020-03-10 NOTE — Progress Notes (Signed)
Office Visit Note   Patient: Hannah Bush           Date of Birth: 10-08-1995           MRN: [id number] Visit Date: 03/10/2020              Requested by: No referring provider defined for this encounter. PCP: Default, Provider, MD  Chief Complaint  Patient presents with  . 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  Patient denies any injury.  Patient has been working aggressively with the trainers for ankle strengthening.  Assessment & Plan: Visit Diagnoses:  1. Pain in right ankle and joints of right foot     Plan: Patient should continue with activities as tolerated continue with fascial and ankle strengthening continue with orthotics to unload the posterior tibial tendon.  Recommended topical Voltaren gel continue with taping and bracing for sports.  Follow-Up Instructions: Return if symptoms worsen or fail to improve.   Ortho Exam  Patient is alert, oriented, no adenopathy, well-dressed, normal affect, normal respiratory effort. Examination patient has swelling and point tenderness to palpation over the deltoid ligament, ankle joint is nontender to palpation she has good ankle good subtalar motion.  Anterior drawer is stable.  Patient can do a single limb heel raise without weakness good hindfoot varus.  She does have a little bit of increased valgus on the right compared to the left.  Review of the MRI scan shows no tear of the deltoid ligament does show some inflammation around the posterior tibial tendon as well as a sprain of the Lisfranc ligament.  Patient's Lisfranc complex is nontender to palpation.  There is no cellulitis no signs of infection.  There is no joint effusion.  Imaging: No results found. No images are attached to the encounter.  Labs: No results found for: HGBA1C, ESRSEDRATE, CRP, LABURIC, REPTSTATUS, GRAMSTAIN, CULT,  LABORGA   No results found for: ALBUMIN, PREALBUMIN, LABURIC  No results found for: MG No results found for: VD25OH  No results found for: PREALBUMIN CBC EXTENDED Latest Ref Rng & Units 05/01/2018  WBC 4.0 - 10.5 K/uL 5.3  RBC 3.87 - 5.11 MIL/uL 4.52  HGB 12.0 - 15.0 g/dL 70.2  HCT 63.7 - 85.8 % 42.2  PLT 150 - 400 K/uL 222     There is no height or weight on file to calculate BMI.  Orders:  No orders of the defined types were placed in this encounter.  No orders of the defined types were placed in this encounter.    Procedures: No procedures performed  Clinical Data: No additional findings.  ROS:  All other systems negative, except as noted in the HPI. Review of Systems  Objective: Vital Signs: There were no vitals taken for this visit.  Specialty Comments:  No specialty comments available.  PMFS History: Patient Active Problem List   Diagnosis Date Noted  . Ankle impingement syndrome, right   . Syncope 05/10/2018   Past Medical History:  Diagnosis Date  . Ankle impingement syndrome, right   . Laryngeal disorder   . SOB (shortness of breath)   . Syncope 05/10/2018  . Vasovagal syncope     Family History  Problem Relation Age of Onset  . Hypotension Mother   . Hypotension Sister     Past Surgical History:  Procedure  Laterality Date  . ANKLE ARTHROSCOPY Right 03/13/2019   Procedure: RIGHT ANKLE ARTHROSCOPY AND DEBRIDEMENT;  Surgeon: Nadara Mustard, MD;  Location: [facility];  Service: Orthopedics;  Laterality: Right;  . POLYPECTOMY    . TONSILLECTOMY    . TYMPANOSTOMY TUBE PLACEMENT     Social History   Occupational History  . Not on file  Tobacco Use  . Smoking status: Never Smoker  . Smokeless tobacco: Never Used  Vaping Use  . Vaping Use: Never used  Substance and Sexual Activity  . Alcohol use: Never  . Drug use: Never  . Sexual activity: Not on file

## 2020-10-12 IMAGING — MR MR CARD MORPHOLOGY WO/W CM
31 of 33 series · 36 of 40 positions shown · IV contrast (gadavist)
Comparison: none

CLINICAL DATA: 23-year-old female, [REDACTED] basketball player with h/o
recurrent syncope and an echocardiogram suspicious for hypertrophic
cardiomyopathy.

EXAM:
CARDIAC MRI
TECHNIQUE: The patient was scanned on a 1.5 Tesla GE magnet. A dedicated
cardiac coil was used. Functional imaging was done using Fiesta
sequences. [DATE], and 4 chamber views were done to assess for RWMA's.
Modified Ersoy rule using a short axis stack was used to
calculate an ejection fraction on a dedicated work station using
Circle software. The patient received 9 cc of Gadavist. After 10
minutes inversion recovery sequences were used to assess for
infiltration and scar tissue.
CONTRAST:  9 cc  of Gadavist

[Series 6: bSSFP · oblique · 8.0mm · 1.43mm/px · 1 of 25 slices shown (1 of 18)]
[im 1/25]
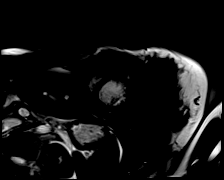

[Series 6: bSSFP · oblique · 8.0mm · 1.43mm/px · 1 of 25 slices shown (2 of 18)]
[im 1/25]
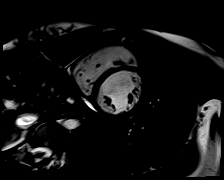

[Series 6: bSSFP · oblique · 8.0mm · 1.43mm/px · 1 of 25 slices shown (3 of 18)]
[im 1/25]
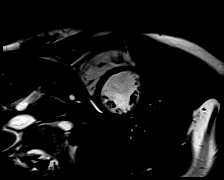

[Series 6: bSSFP · oblique · 8.0mm · 1.43mm/px · 1 of 25 slices shown (4 of 18)]
[im 1/25]
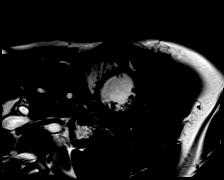

[Series 6: bSSFP · oblique · 8.0mm · 1.43mm/px · 1 of 25 slices shown (5 of 18)]
[im 1/25]
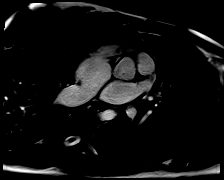

[Series 6: bSSFP · oblique · 8.0mm · 1.43mm/px · 1 of 25 slices shown (6 of 18)]
[im 1/25]
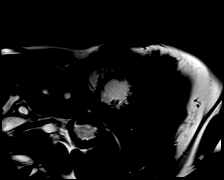

[Series 6: bSSFP · oblique · 8.0mm · 1.43mm/px · 1 of 25 slices shown (7 of 18)]
[im 1/25]
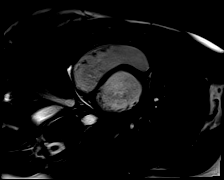

[Series 6: bSSFP · oblique · 8.0mm · 1.43mm/px · 1 of 25 slices shown (8 of 18)]
[im 1/25]
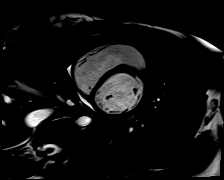

[Series 6: bSSFP · oblique · 8.0mm · 1.43mm/px · 1 of 25 slices shown (9 of 18)]
[im 1/25]
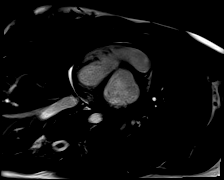

[Series 6: bSSFP · oblique · 8.0mm · 1.43mm/px · 1 of 25 slices shown (10 of 18)]
[im 1/25]
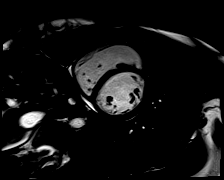

[Series 6: bSSFP · oblique · 8.0mm · 1.43mm/px · 1 of 25 slices shown (11 of 18)]
[im 1/25]
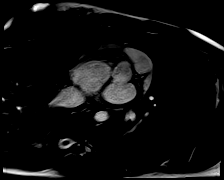

[Series 6: bSSFP · oblique · 8.0mm · 1.43mm/px · 1 of 25 slices shown (12 of 18)]
[im 1/25]
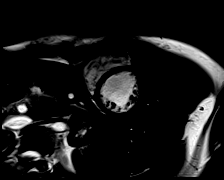

[Series 6: bSSFP · oblique · 8.0mm · 1.43mm/px · 2 of 25 slices shown (13 of 18)]
[im 1/25]
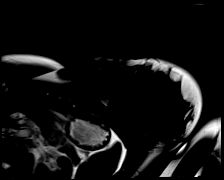
[im 25/25]
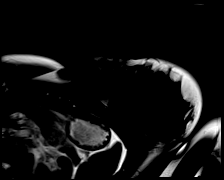

[Series 6: bSSFP · oblique · 8.0mm · 1.43mm/px · 1 of 25 slices shown (14 of 18)]
[im 1/25]
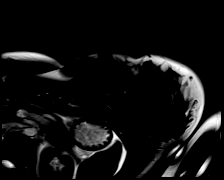

[Series 6: bSSFP · oblique · 8.0mm · 1.43mm/px · 1 of 25 slices shown (15 of 18)]
[im 1/25]
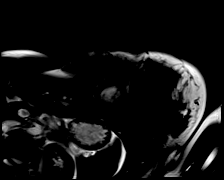

[Series 8: STIR · oblique · 8.0mm · 1.54mm/px · 1 of 15 slices shown]
[im 1/15]
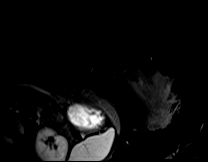

[Series 10: (id)_long_t1_moco · oblique · 8.0mm · 1.25mm/px · 1 of 24 slices shown]
[im 1/24]
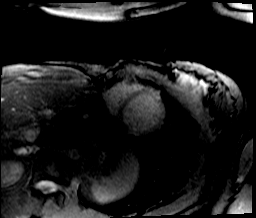

[Series 14: (id)_trufi_moco · oblique · 8.0mm · 1.67mm/px · 1 of 9 slices shown]
[im 1/9]
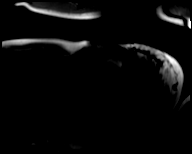

[Series 17: t2_stir_db_radial ((date)ch) · oblique · 6.0mm · 1.73mm/px · 1 of 3 slices shown]
[im 1/3]
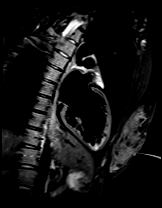

[Series 18: bSSFP · oblique · 7.0mm · 1.41mm/px · 1 of 25 slices shown (16 of 18)]
[im 1/25]
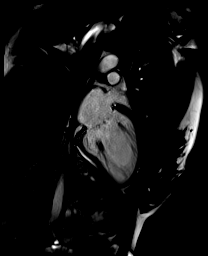

[Series 19: bSSFP · oblique · 7.0mm · 1.41mm/px · 1 of 25 slices shown (17 of 18)]
[im 1/25]
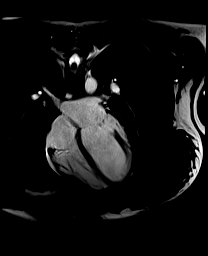

[Series 20: bSSFP · oblique · 7.0mm · 1.41mm/px · 1 of 25 slices shown (18 of 18)]
[im 1/25]
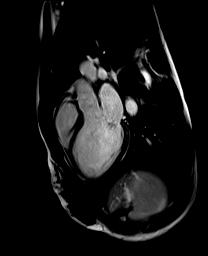

[Series 22: lge_single shot sa · oblique · 8.0mm · 1.67mm/px · 1 of 15 slices shown (1 of 2)]
[im 1/15]
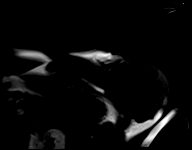

[Series 23: lge_single shot sa · oblique · 8.0mm · 1.67mm/px · 1 of 15 slices shown (2 of 2)]
[im 1/15]
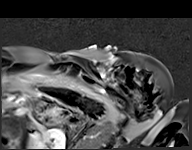

[Series 31: (id)_short_t1_moco · oblique · 8.0mm · 1.25mm/px · 2 of 27 slices shown]
[im 1/27]
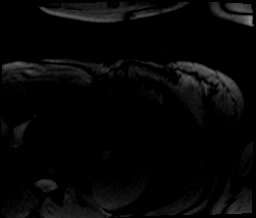
[im 27/27]
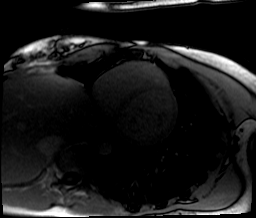

[Series 32: (id)_short_t1_moco_t1 · oblique · 8.0mm · 1.25mm/px · 1 of 6 slices shown]
[im 1/6]
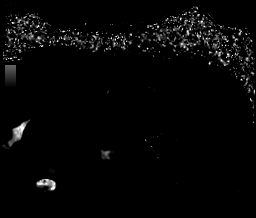

[Series 35: lge short axis_mag · oblique · 8.0mm · 1.43mm/px · 1 of 13 slices shown]
[im 1/13]
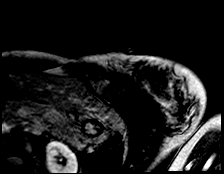

[Series 36: lge short axis_psir · oblique · 8.0mm · 1.43mm/px · 1 of 13 slices shown]
[im 1/13]
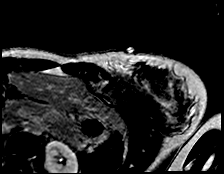

[Series 43: aorta pc flow_250_tp_retro_bh · oblique · 6.0mm · 1.73mm/px · 2 of 30 slices shown]
[im 1/30]
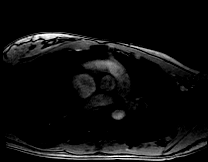
[im 30/30]
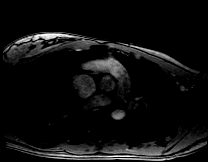

[Series 44: aorta pc flow_250_tp_retro_bh_mag · oblique · 6.0mm · 1.73mm/px · 2 of 28 slices shown]
[im 1/28]
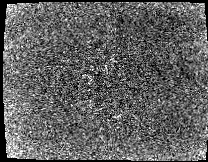
[im 28/28]
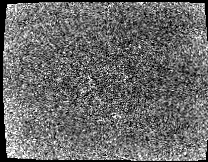

[Series 45: aorta pc flow_250_tp_retro_bh_p · oblique · 6.0mm · 1.73mm/px · 2 of 30 slices shown]
[im 1/30]
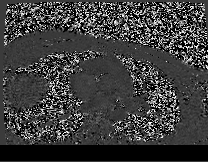
[im 30/30]
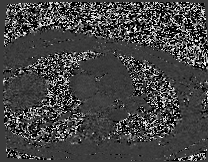

[36 of 40 positions shown; findings below may reference images not displayed]

FINDINGS: 1. Mildly dilated left ventricle with normal wall thickness and
systolic function (LVEF = 61%). There are no regional wall motion
abnormalities.

There is no late gadolinium enhancement in the left ventricular
myocardium.

LVEDD: 62 mm

LVESD: 43 mm

LVEDV: 216 ml

LVESV: 85 ml

SV: 131 ml

CO: 7.6 L/min

Myocardial mass: 148 g

2. Normal right ventricular size, thickness and systolic function
(LVEF = 58%). There are no regional wall motion abnormalities.

3.  Normal left and right atrial size.

4. Normal size of the aortic root, ascending aorta and pulmonary
artery.

5.  Trivial mitral and tricuspid regurgitation.

6.  Normal pericardium.  No pericardial effusion.
IMPRESSION: 1. Mildly dilated left ventricle with normal wall thickness and
systolic function (LVEF = 61%). There are no regional wall motion
abnormalities.

There is no late gadolinium enhancement in the left ventricular
myocardium. Mild LV dilatation is considered normal in athletes.

2. Normal right ventricular size, thickness and systolic function
(LVEF = 58%). There are no regional wall motion abnormalities.

3.  Normal left and right atrial size.

4. Normal size of the aortic root, ascending aorta and pulmonary
artery.

5.  Trivial mitral and tricuspid regurgitation.

6.  Normal pericardium.  No pericardial effusion.

Normal cardiac MRI. There is no evidence for hypertrophic
cardiomyopathy.
